# Patient Record
Sex: Female | Born: 1995 | Race: Black or African American | Hispanic: No | Marital: Single
Health system: Midwestern US, Community
[De-identification: ages and names within clinical notes are randomized; demographics above are authoritative.]

## PROBLEM LIST (undated history)

## (undated) DIAGNOSIS — R87629 Unspecified abnormal cytological findings in specimens from vagina: Secondary | ICD-10-CM

## (undated) DIAGNOSIS — D649 Anemia, unspecified: Secondary | ICD-10-CM

## (undated) DIAGNOSIS — R519 Headache, unspecified: Secondary | ICD-10-CM

## (undated) DIAGNOSIS — Z141 Cystic fibrosis carrier: Secondary | ICD-10-CM

## (undated) HISTORY — DX: Headache, unspecified: R51.9

## (undated) HISTORY — DX: Unspecified abnormal cytological findings in specimens from vagina: R87.629

---
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
## 2015-05-11 DIAGNOSIS — O4590 Premature separation of placenta, unspecified, unspecified trimester: Secondary | ICD-10-CM

## 2017-04-23 ENCOUNTER — Ambulatory Visit (INDEPENDENT_AMBULATORY_CARE_PROVIDER_SITE_OTHER): Payer: Medicaid Other | Admitting: Certified Nurse Midwife

## 2017-04-23 ENCOUNTER — Encounter: Payer: Self-pay | Admitting: Certified Nurse Midwife

## 2017-04-23 ENCOUNTER — Other Ambulatory Visit (HOSPITAL_COMMUNITY)
Admission: RE | Admit: 2017-04-23 | Discharge: 2017-04-23 | Disposition: A | Discharge status: Home / Self Care | Payer: Medicaid Other | Source: Ambulatory Visit | Attending: Certified Nurse Midwife | Admitting: Certified Nurse Midwife

## 2017-04-23 VITALS — BP 109/66 | HR 96 | Ht 62.0 in | Wt 110.0 lb

## 2017-04-23 DIAGNOSIS — O99613 Diseases of the digestive system complicating pregnancy, third trimester: Secondary | ICD-10-CM | POA: Diagnosis not present

## 2017-04-23 DIAGNOSIS — K219 Gastro-esophageal reflux disease without esophagitis: Secondary | ICD-10-CM | POA: Diagnosis not present

## 2017-04-23 DIAGNOSIS — O0933 Supervision of pregnancy with insufficient antenatal care, third trimester: Secondary | ICD-10-CM | POA: Diagnosis not present

## 2017-04-23 DIAGNOSIS — O0990 Supervision of high risk pregnancy, unspecified, unspecified trimester: Secondary | ICD-10-CM | POA: Insufficient documentation

## 2017-04-23 DIAGNOSIS — Z3A31 31 weeks gestation of pregnancy: Secondary | ICD-10-CM | POA: Insufficient documentation

## 2017-04-23 DIAGNOSIS — Z23 Encounter for immunization: Secondary | ICD-10-CM | POA: Diagnosis not present

## 2017-04-23 DIAGNOSIS — O0930 Supervision of pregnancy with insufficient antenatal care, unspecified trimester: Secondary | ICD-10-CM

## 2017-04-23 DIAGNOSIS — Z8759 Personal history of other complications of pregnancy, childbirth and the puerperium: Secondary | ICD-10-CM | POA: Insufficient documentation

## 2017-04-23 DIAGNOSIS — O0993 Supervision of high risk pregnancy, unspecified, third trimester: Secondary | ICD-10-CM

## 2017-04-23 DIAGNOSIS — O219 Vomiting of pregnancy, unspecified: Secondary | ICD-10-CM | POA: Diagnosis not present

## 2017-04-23 DIAGNOSIS — Z98891 History of uterine scar from previous surgery: Secondary | ICD-10-CM | POA: Diagnosis not present

## 2017-04-23 HISTORY — DX: Supervision of high risk pregnancy, unspecified, unspecified trimester: O09.90

## 2017-04-23 HISTORY — DX: Supervision of pregnancy with insufficient antenatal care, unspecified trimester: O09.30

## 2017-04-23 MED ORDER — DOXYLAMINE-PYRIDOXINE 10-10 MG PO TBEC: DELAYED_RELEASE_TABLET | ORAL | 4 refills | Status: DC

## 2017-04-23 MED ORDER — OMEPRAZOLE 20 MG PO CPDR: 20.0000 mg | DELAYED_RELEASE_CAPSULE | Freq: Two times a day (BID) | ORAL | 5 refills | Status: DC

## 2017-04-23 MED ORDER — VITAFOL-NANO 18-0.6-0.4 MG PO TABS: 1.0000 | ORAL_TABLET | Freq: Every day | ORAL | 12 refills | Status: DC

## 2017-04-23 MED ORDER — ONDANSETRON HCL 8 MG PO TABS: 8.0000 mg | ORAL_TABLET | Freq: Three times a day (TID) | ORAL | 2 refills | Status: DC | PRN

## 2017-04-23 NOTE — Progress Notes (Signed)
Pt presents for initial NOB visit. [place] Lives with FOB Annette Rodriguez. Pt c/o N&V. Pt opts for repeat c/s.

## 2017-04-23 NOTE — Progress Notes (Signed)
Subjective:   Annette Rodriguez is a 22 y.o. G2P0101 at [redacted]w[redacted]d by LMP being seen today for her first obstetrical visit.  Her obstetrical history is significant for previous 32 week delivery via emergency C-section for abruption in [place], medical records requested.  Does want repeat C-section. Patient does intend to breast feed. Pregnancy history fully reviewed.   Had prenatal care in [place], does not have all the records.   Patient reports heartburn, nausea, no bleeding, no leaking, occasional contractions and vomiting.  HISTORY: Obstetric History   G2   P1   T0   P1   A0   L1    SAB0   TAB0   Ectopic0   Multiple0   Live Births1     # Outcome Date GA Lbr Len/2nd Weight Sex Delivery Anes PTL Lv  2 Current           1 Preterm 05/11/15 [redacted]w[redacted]d  3 lb 15 oz (1.786 kg) F CS-LTranv EPI Y LIV     Complications: Placental abruption      Last pap smear was done unknown  History reviewed. No pertinent past medical history. Past Surgical History:  Procedure Laterality Date  . CESAREAN SECTION  05/11/2015   History reviewed. No pertinent family history. Social History   Tobacco Use  . Smoking status: Never Smoker  . Smokeless tobacco: Never Used  Substance Use Topics  . Alcohol use: No    Frequency: Never  . Drug use: No    Comment: Stopped +UPT    No Known Allergies Current Outpatient Medications on File Prior to Visit  Medication Sig Dispense Refill  . Prenatal Vit w/Fe-Methylfol-FA (PNV PO) Take by mouth.     No current facility-administered medications on file prior to visit.     Review of Systems Pertinent items noted in HPI and remainder of comprehensive ROS otherwise negative.  Exam   Vitals:   04/23/17 1136 04/23/17 1142  BP: 109/66   Pulse: 96   Weight: 110 lb (49.9 kg)   Height:  5\' 2"  (1.575 m)   Fetal Heart Rate (bpm): 152; doppler  Uterus:  Fundal Height: 26 cm  Pelvic Exam: Perineum: no hemorrhoids, normal perineum   Vulva: normal external genitalia, no lesions     Vagina:  normal mucosa, normal discharge   Cervix: no lesions and normal, pap smear done.    Adnexa: normal adnexa and no mass, fullness, tenderness   Bony Pelvis: average  System: General: well-developed, well-nourished female in no acute distress   Breast:  normal appearance, no masses or tenderness   Skin: normal coloration and turgor, no rashes   Neurologic: oriented, normal, negative, normal mood   Extremities: normal strength, tone, and muscle mass, ROM of all joints is normal   HEENT PERRLA, extraocular movement intact and sclera clear, anicteric   Mouth/Teeth mucous membranes moist, pharynx normal without lesions and dental hygiene good   Neck supple and no masses   Cardiovascular: regular rate and rhythm   Respiratory:  no respiratory distress, normal breath sounds   Abdomen: soft, non-tender; bowel sounds normal; no masses,  no organomegaly     Assessment:   Pregnancy: [id number] Patient Active Problem List   Diagnosis Date Noted  . Supervision of high risk pregnancy, antepartum 04/23/2017  . History of placenta abruption 04/23/2017  . Insufficient prenatal care 04/23/2017  . History of C-section 04/23/2017     Plan:  1. Supervision of high risk pregnancy, antepartum  Records requested.  S<D.  Repeat C-section - Obstetric Panel, Including HIV - Culture, OB Urine - Hemoglobinopathy evaluation - Hemoglobin A1c - VITAMIN D 25 Hydroxy (Vit-D Deficiency, Fractures) - Genetic Screening - Inheritest Core(CF97,SMA,FraX) - Cytology - PAP - Cervicovaginal ancillary only - US MFM OB COMP + 14 WK; Future - Tdap vaccine greater than or equal to 7yo IM - Prenatal-Fe Fum-Methf-FA w/o A (VITAFOL-NANO) 18-0.6-0.4 MG TABS; Take 1 tablet by mouth daily.  Dispense: 30 tablet; Refill: 12  2. History of placenta abruption     Precautions reviewed.   3. Insufficient prenatal care in third trimester     @31  weeks, limited records recieved  4. Nausea/vomiting in pregnancy       - Doxylamine-Pyridoxine (DICLEGIS) 10-10 MG TBEC; Take 1 tablet with breakfast and lunch.  Take 2 tablets at bedtime.  Dispense: 100 tablet; Refill: 4 - ondansetron (ZOFRAN) 8 MG tablet; Take 1 tablet (8 mg total) by mouth every 8 (eight) hours as needed for nausea or vomiting.  Dispense: 40 tablet; Refill: 2  5. Gastroesophageal reflux during pregnancy, antepartum, third trimester     OTC TUMS - omeprazole (PRILOSEC) 20 MG capsule; Take 1 capsule (20 mg total) by mouth 2 (two) times daily before a meal.  Dispense: 60 capsule; Refill: 5   Initial labs drawn. Continue prenatal vitamins. Genetic Screening discussed, NIPS: ordered. Ultrasound discussed; fetal anatomic survey: ordered. Problem list reviewed and updated. The nature of [facility] with multiple MDs and other Advanced Practice Providers was explained to patient; also emphasized that residents, students are part of our team. Routine obstetric precautions reviewed. Return in about 2 weeks (around 05/07/2017) for ROB, needs 2 hour OGTT ASAP, Needs to see FP MD here, HOB.     Orvilla Cornwallachelle Elizah Mierzwa, CNM [facility]

## 2017-04-24 LAB — CERVICOVAGINAL ANCILLARY ONLY
Bacterial vaginitis: NEGATIVE
CANDIDA VAGINITIS: NEGATIVE
Chlamydia: NEGATIVE
Neisseria Gonorrhea: NEGATIVE
Trichomonas: NEGATIVE

## 2017-04-24 LAB — VITAMIN D 25 HYDROXY (VIT D DEFICIENCY, FRACTURES): VIT D 25 HYDROXY: 12.7 ng/mL — AB (ref 30.0–100.0)

## 2017-04-25 ENCOUNTER — Other Ambulatory Visit: Payer: Self-pay | Admitting: Certified Nurse Midwife

## 2017-04-25 ENCOUNTER — Other Ambulatory Visit: Payer: Medicaid Other

## 2017-04-25 DIAGNOSIS — E559 Vitamin D deficiency, unspecified: Secondary | ICD-10-CM

## 2017-04-25 HISTORY — DX: Vitamin D deficiency, unspecified: E55.9

## 2017-04-25 LAB — OBSTETRIC PANEL, INCLUDING HIV
ANTIBODY SCREEN: NEGATIVE
Basophils Absolute: 0 10*3/uL (ref 0.0–0.2)
Basos: 0 %
EOS (ABSOLUTE): 0 10*3/uL (ref 0.0–0.4)
Eos: 0 %
HEMOGLOBIN: 9.3 g/dL — AB (ref 11.1–15.9)
HEP B S AG: NEGATIVE
HIV Screen 4th Generation wRfx: NONREACTIVE
Hematocrit: 29.2 % — ABNORMAL LOW (ref 34.0–46.6)
IMMATURE GRANS (ABS): 0.1 10*3/uL (ref 0.0–0.1)
Immature Granulocytes: 1 %
LYMPHS: 17 %
Lymphocytes Absolute: 2.1 10*3/uL (ref 0.7–3.1)
MCH: 28.1 pg (ref 26.6–33.0)
MCHC: 31.8 g/dL (ref 31.5–35.7)
MCV: 88 fL (ref 79–97)
MONOCYTES: 6 %
Monocytes Absolute: 0.7 10*3/uL (ref 0.1–0.9)
Neutrophils Absolute: 9.3 10*3/uL — ABNORMAL HIGH (ref 1.4–7.0)
Neutrophils: 76 %
Platelets: 277 10*3/uL (ref 150–379)
RBC: 3.31 x10E6/uL — ABNORMAL LOW (ref 3.77–5.28)
RDW: 13.6 % (ref 12.3–15.4)
RPR: NONREACTIVE
RUBELLA: 3.02 {index} (ref >0.99)
Rh Factor: POSITIVE
WBC: 12.3 10*3/uL — ABNORMAL HIGH (ref 3.4–10.8)

## 2017-04-25 LAB — HEMOGLOBINOPATHY EVALUATION
HEMOGLOBIN A2 QUANTITATION: 1.9 % (ref 1.8–3.2)
HEMOGLOBIN F QUANTITATION: 0 % (ref 0.0–2.0)
HGB C: 0 %
HGB S: 0 %
HGB VARIANT: 0 %
Hgb A: 98.1 % (ref 96.4–98.8)

## 2017-04-25 LAB — CULTURE, OB URINE

## 2017-04-25 LAB — CYTOLOGY - PAP
Adequacy: ABSENT
Diagnosis: NEGATIVE

## 2017-04-25 LAB — URINE CULTURE, OB REFLEX

## 2017-04-25 LAB — HEMOGLOBIN A1C
Est. average glucose Bld gHb Est-mCnc: 100 mg/dL
Hgb A1c MFr Bld: 5.1 % (ref 4.8–5.6)

## 2017-04-25 MED ORDER — VITAMIN D (ERGOCALCIFEROL) 1.25 MG (50000 UNIT) PO CAPS: 50000.0000 [IU] | ORAL_CAPSULE | ORAL | 2 refills | Status: DC

## 2017-04-29 ENCOUNTER — Encounter (HOSPITAL_COMMUNITY): Payer: Self-pay

## 2017-04-29 ENCOUNTER — Telehealth: Payer: Self-pay

## 2017-04-29 ENCOUNTER — Ambulatory Visit (HOSPITAL_COMMUNITY)
Admission: RE | Admit: 2017-04-29 | Discharge: 2017-04-29 | Disposition: A | Discharge status: Home / Self Care | Payer: Medicaid Other | Source: Ambulatory Visit | Attending: Certified Nurse Midwife | Admitting: Certified Nurse Midwife

## 2017-04-29 ENCOUNTER — Other Ambulatory Visit: Payer: Self-pay | Admitting: Certified Nurse Midwife

## 2017-04-29 ENCOUNTER — Other Ambulatory Visit: Payer: Medicaid Other

## 2017-04-29 DIAGNOSIS — O0990 Supervision of high risk pregnancy, unspecified, unspecified trimester: Secondary | ICD-10-CM

## 2017-04-29 DIAGNOSIS — O0993 Supervision of high risk pregnancy, unspecified, third trimester: Secondary | ICD-10-CM | POA: Diagnosis present

## 2017-04-29 DIAGNOSIS — O99013 Anemia complicating pregnancy, third trimester: Secondary | ICD-10-CM

## 2017-04-29 DIAGNOSIS — O09293 Supervision of pregnancy with other poor reproductive or obstetric history, third trimester: Secondary | ICD-10-CM | POA: Diagnosis present

## 2017-04-29 DIAGNOSIS — Z98891 History of uterine scar from previous surgery: Secondary | ICD-10-CM | POA: Insufficient documentation

## 2017-04-29 DIAGNOSIS — O09213 Supervision of pregnancy with history of pre-term labor, third trimester: Secondary | ICD-10-CM

## 2017-04-29 DIAGNOSIS — Z3A32 32 weeks gestation of pregnancy: Secondary | ICD-10-CM | POA: Insufficient documentation

## 2017-04-29 DIAGNOSIS — O09299 Supervision of pregnancy with other poor reproductive or obstetric history, unspecified trimester: Secondary | ICD-10-CM

## 2017-04-29 DIAGNOSIS — O99019 Anemia complicating pregnancy, unspecified trimester: Secondary | ICD-10-CM | POA: Insufficient documentation

## 2017-04-29 HISTORY — DX: Anemia complicating pregnancy, third trimester: O99.013

## 2017-04-29 MED ORDER — CITRANATAL BLOOM 90-1 MG PO TABS: 1.0000 | ORAL_TABLET | Freq: Every day | ORAL | 12 refills | Status: DC

## 2017-04-29 NOTE — Telephone Encounter (Signed)
-----   Message from Roe Coombsachelle A Denney, CNM sent at 04/29/2017  9:01 AM EST ----- Please let her know that she is anemic.  I have added bloom to her other PNV.  Thank you. R.Denney CNM

## 2017-04-29 NOTE — Telephone Encounter (Signed)
Patient notified.  Verbalized understanding. 

## 2017-04-30 LAB — GLUCOSE TOLERANCE, 2 HOURS W/ 1HR
GLUCOSE, 2 HOUR: 89 mg/dL (ref 65–152)
Glucose, 1 hour: 96 mg/dL (ref 65–179)
Glucose, Fasting: 82 mg/dL (ref 65–91)

## 2017-04-30 LAB — CBC
HEMATOCRIT: 28.5 % — AB (ref 34.0–46.6)
HEMOGLOBIN: 9.6 g/dL — AB (ref 11.1–15.9)
MCH: 29.4 pg (ref 26.6–33.0)
MCHC: 33.7 g/dL (ref 31.5–35.7)
MCV: 87 fL (ref 79–97)
Platelets: 246 10*3/uL (ref 150–379)
RBC: 3.27 x10E6/uL — ABNORMAL LOW (ref 3.77–5.28)
RDW: 13.8 % (ref 12.3–15.4)
WBC: 11.6 10*3/uL — ABNORMAL HIGH (ref 3.4–10.8)

## 2017-04-30 LAB — HIV ANTIBODY (ROUTINE TESTING W REFLEX): HIV Screen 4th Generation wRfx: NONREACTIVE

## 2017-04-30 LAB — RPR: RPR: NONREACTIVE

## 2017-05-01 ENCOUNTER — Other Ambulatory Visit: Payer: Self-pay | Admitting: Certified Nurse Midwife

## 2017-05-01 DIAGNOSIS — O99013 Anemia complicating pregnancy, third trimester: Secondary | ICD-10-CM

## 2017-05-01 DIAGNOSIS — O0990 Supervision of high risk pregnancy, unspecified, unspecified trimester: Secondary | ICD-10-CM

## 2017-05-07 ENCOUNTER — Other Ambulatory Visit: Payer: Self-pay | Admitting: Certified Nurse Midwife

## 2017-05-07 ENCOUNTER — Ambulatory Visit (INDEPENDENT_AMBULATORY_CARE_PROVIDER_SITE_OTHER): Payer: Medicaid Other | Admitting: Obstetrics and Gynecology

## 2017-05-07 ENCOUNTER — Encounter: Payer: Self-pay | Admitting: Obstetrics and Gynecology

## 2017-05-07 VITALS — BP 116/70 | HR 102 | Wt 114.5 lb

## 2017-05-07 DIAGNOSIS — O0990 Supervision of high risk pregnancy, unspecified, unspecified trimester: Secondary | ICD-10-CM

## 2017-05-07 DIAGNOSIS — Z141 Cystic fibrosis carrier: Secondary | ICD-10-CM | POA: Diagnosis not present

## 2017-05-07 DIAGNOSIS — Z98891 History of uterine scar from previous surgery: Secondary | ICD-10-CM

## 2017-05-07 DIAGNOSIS — Z8759 Personal history of other complications of pregnancy, childbirth and the puerperium: Secondary | ICD-10-CM | POA: Diagnosis not present

## 2017-05-07 LAB — INHERITEST CORE(CF97,SMA,FRAX)

## 2017-05-07 NOTE — Progress Notes (Signed)
Pt complains of having bilateral leg pain

## 2017-05-07 NOTE — Progress Notes (Signed)
Subjective:  Annette Rodriguez is a 22 y.o. G2P0101 at 1258w1d being seen today for ongoing prenatal care.  She is currently monitored for the following issues for this high-risk pregnancy and has Supervision of high risk pregnancy, antepartum; History of placenta abruption; Insufficient prenatal care; History of C-section; Vitamin D deficiency; Anemia affecting pregnancy in third trimester; and Cystic fibrosis carrier on their problem list.  Patient reports no complaints.  Contractions: Irregular. Vag. Bleeding: None.  Movement: Present. Denies leaking of fluid.   The following portions of the patient's history were reviewed and updated as appropriate: allergies, current medications, past family history, past medical history, past social history, past surgical history and problem list. Problem list updated.  Objective:   Vitals:   05/07/17 0959  BP: 116/70  Pulse: (!) 102  Weight: 114 lb 8 oz (51.9 kg)    Fetal Status: Fetal Heart Rate (bpm): 155   Movement: Present     General:  Alert, oriented and cooperative. Patient is in no acute distress.  Skin: Skin is warm and dry. No rash noted.   Cardiovascular: Normal heart rate noted  Respiratory: Normal respiratory effort, no problems with respiration noted  Abdomen: Soft, gravid, appropriate for gestational age. Pain/Pressure: Absent     Pelvic:  Cervical exam deferred        Extremities: Normal range of motion.  Edema: None  Mental Status: Normal mood and affect. Normal behavior. Normal judgment and thought content.   Urinalysis:      Assessment and Plan:  Pregnancy: G2P0101 at 7158w1d  1. Supervision of high risk pregnancy, antepartum Stable  2. History of placenta abruption   3. History of C-section Desires repeat  4. Cystic fibrosis carrier   Preterm labor symptoms and general obstetric precautions including but not limited to vaginal bleeding, contractions, leaking of fluid and fetal movement were reviewed in detail with the  patient. Please refer to After Visit Summary for other counseling recommendations.  Return in about 2 weeks (around 05/21/2017) for OB visit.   Hermina StaggersErvin, Romone Shaff L, MD

## 2017-05-07 NOTE — Patient Instructions (Signed)
Third Trimester of Pregnancy The third trimester is from week 28 through week 40 (months 7 through 9). The third trimester is a time when the unborn baby (fetus) is growing rapidly. At the end of the ninth month, the fetus is about 20 inches in length and weighs 6-10 pounds. Body changes during your third trimester Your body will continue to go through many changes during pregnancy. The changes vary from woman to woman. During the third trimester:  Your weight will continue to increase. You can expect to gain 25-35 pounds (11-16 kg) by the end of the pregnancy.  You may begin to get stretch marks on your hips, abdomen, and breasts.  You may urinate more often because the fetus is moving lower into your pelvis and pressing on your bladder.  You may develop or continue to have heartburn. This is caused by increased hormones that slow down muscles in the digestive tract.  You may develop or continue to have constipation because increased hormones slow digestion and cause the muscles that push waste through your intestines to relax.  You may develop hemorrhoids. These are swollen veins (varicose veins) in the rectum that can itch or be painful.  You may develop swollen, bulging veins (varicose veins) in your legs.  You may have increased body aches in the pelvis, back, or thighs. This is due to weight gain and increased hormones that are relaxing your joints.  You may have changes in your hair. These can include thickening of your hair, rapid growth, and changes in texture. Some women also have hair loss during or after pregnancy, or hair that feels dry or thin. Your hair will most likely return to normal after your baby is born.  Your breasts will continue to grow and they will continue to become tender. A yellow fluid (colostrum) may leak from your breasts. This is the first milk you are producing for your baby.  Your belly button may stick out.  You may notice more swelling in your hands,  face, or ankles.  You may have increased tingling or numbness in your hands, arms, and legs. The skin on your belly may also feel numb.  You may feel short of breath because of your expanding uterus.  You may have more problems sleeping. This can be caused by the size of your belly, increased need to urinate, and an increase in your body's metabolism.  You may notice the fetus "dropping," or moving lower in your abdomen (lightening).  You may have increased vaginal discharge.  You may notice your joints feel loose and you may have pain around your pelvic bone.  What to expect at prenatal visits You will have prenatal exams every 2 weeks until week 36. Then you will have weekly prenatal exams. During a routine prenatal visit:  You will be weighed to make sure you and the baby are growing normally.  Your blood pressure will be taken.  Your abdomen will be measured to track your baby's growth.  The fetal heartbeat will be listened to.  Any test results from the previous visit will be discussed.  You may have a cervical check near your due date to see if your cervix has softened or thinned (effaced).  You will be tested for Group B streptococcus. This happens between 35 and 37 weeks.  Your health care provider may ask you:  What your birth plan is.  How you are feeling.  If you are feeling the baby move.  If you have had   any abnormal symptoms, such as leaking fluid, bleeding, severe headaches, or abdominal cramping.  If you are using any tobacco products, including cigarettes, chewing tobacco, and electronic cigarettes.  If you have any questions.  Other tests or screenings that may be performed during your third trimester include:  Blood tests that check for low iron levels (anemia).  Fetal testing to check the health, activity level, and growth of the fetus. Testing is done if you have certain medical conditions or if there are problems during the  pregnancy.  Nonstress test (NST). This test checks the health of your baby to make sure there are no signs of problems, such as the baby not getting enough oxygen. During this test, a belt is placed around your belly. The baby is made to move, and its heart rate is monitored during movement.  What is false labor? False labor is a condition in which you feel small, irregular tightenings of the muscles in the womb (contractions) that usually go away with rest, changing position, or drinking water. These are called Braxton Hicks contractions. Contractions may last for hours, days, or even weeks before true labor sets in. If contractions come at regular intervals, become more frequent, increase in intensity, or become painful, you should see your health care provider. What are the signs of labor?  Abdominal cramps.  Regular contractions that start at 10 minutes apart and become stronger and more frequent with time.  Contractions that start on the top of the uterus and spread down to the lower abdomen and back.  Increased pelvic pressure and dull back pain.  A watery or bloody mucus discharge that comes from the vagina.  Leaking of amniotic fluid. This is also known as your "water breaking." It could be a slow trickle or a gush. Let your health care provider know if it has a color or strange odor. If you have any of these signs, call your health care provider right away, even if it is before your due date. Follow these instructions at home: Medicines  Follow your health care provider's instructions regarding medicine use. Specific medicines may be either safe or unsafe to take during pregnancy.  Take a prenatal vitamin that contains at least 600 micrograms (mcg) of folic acid.  If you develop constipation, try taking a stool softener if your health care provider approves. Eating and drinking  Eat a balanced diet that includes fresh fruits and vegetables, whole grains, good sources of protein  such as meat, eggs, or tofu, and low-fat dairy. Your health care provider will help you determine the amount of weight gain that is right for you.  Avoid raw meat and uncooked cheese. These carry germs that can cause birth defects in the baby.  If you have low calcium intake from food, talk to your health care provider about whether you should take a daily calcium supplement.  Eat four or five small meals rather than three large meals a day.  Limit foods that are high in fat and processed sugars, such as fried and sweet foods.  To prevent constipation: ? Drink enough fluid to keep your urine clear or pale yellow. ? Eat foods that are high in fiber, such as fresh fruits and vegetables, whole grains, and beans. Activity  Exercise only as directed by your health care provider. Most women can continue their usual exercise routine during pregnancy. Try to exercise for 30 minutes at least 5 days a week. Stop exercising if you experience uterine contractions.  Avoid heavy   lifting.  Do not exercise in extreme heat or humidity, or at high altitudes.  Wear low-heel, comfortable shoes.  Practice good posture.  You may continue to have sex unless your health care provider tells you otherwise. Relieving pain and discomfort  Take frequent breaks and rest with your legs elevated if you have leg cramps or low back pain.  Take warm sitz baths to soothe any pain or discomfort caused by hemorrhoids. Use hemorrhoid cream if your health care provider approves.  Wear a good support bra to prevent discomfort from breast tenderness.  If you develop varicose veins: ? Wear support pantyhose or compression stockings as told by your healthcare provider. ? Elevate your feet for 15 minutes, 3-4 times a day. Prenatal care  Write down your questions. Take them to your prenatal visits.  Keep all your prenatal visits as told by your health care provider. This is important. Safety  Wear your seat belt at  all times when driving.  Make a list of emergency phone numbers, including numbers for family, friends, the hospital, and police and fire departments. General instructions  Avoid cat litter boxes and soil used by cats. These carry germs that can cause birth defects in the baby. If you have a cat, ask someone to clean the litter box for you.  Do not travel far distances unless it is absolutely necessary and only with the approval of your health care provider.  Do not use hot tubs, steam rooms, or saunas.  Do not drink alcohol.  Do not use any products that contain nicotine or tobacco, such as cigarettes and e-cigarettes. If you need help quitting, ask your health care provider.  Do not use any medicinal herbs or unprescribed drugs. These chemicals affect the formation and growth of the baby.  Do not douche or use tampons or scented sanitary pads.  Do not cross your legs for long periods of time.  To prepare for the arrival of your baby: ? Take prenatal classes to understand, practice, and ask questions about labor and delivery. ? Make a trial run to the hospital. ? Visit the hospital and tour the maternity area. ? Arrange for maternity or paternity leave through employers. ? Arrange for family and friends to take care of pets while you are in the hospital. ? Purchase a rear-facing car seat and make sure you know how to install it in your car. ? Pack your hospital bag. ? Prepare the baby's nursery. Make sure to remove all pillows and stuffed animals from the baby's crib to prevent suffocation.  Visit your dentist if you have not gone during your pregnancy. Use a soft toothbrush to brush your teeth and be gentle when you floss. Contact a health care provider if:  You are unsure if you are in labor or if your water has broken.  You become dizzy.  You have mild pelvic cramps, pelvic pressure, or nagging pain in your abdominal area.  You have lower back pain.  You have persistent  nausea, vomiting, or diarrhea.  You have an unusual or bad smelling vaginal discharge.  You have pain when you urinate. Get help right away if:  Your water breaks before 37 weeks.  You have regular contractions less than 5 minutes apart before 37 weeks.  You have a fever.  You are leaking fluid from your vagina.  You have spotting or bleeding from your vagina.  You have severe abdominal pain or cramping.  You have rapid weight loss or weight gain.    You have shortness of breath with chest pain.  You notice sudden or extreme swelling of your face, hands, ankles, feet, or legs.  Your baby makes fewer than 10 movements in 2 hours.  You have severe headaches that do not go away when you take medicine.  You have vision changes. Summary  The third trimester is from week 28 through week 40, months 7 through 9. The third trimester is a time when the unborn baby (fetus) is growing rapidly.  During the third trimester, your discomfort may increase as you and your baby continue to gain weight. You may have abdominal, leg, and back pain, sleeping problems, and an increased need to urinate.  During the third trimester your breasts will keep growing and they will continue to become tender. A yellow fluid (colostrum) may leak from your breasts. This is the first milk you are producing for your baby.  False labor is a condition in which you feel small, irregular tightenings of the muscles in the womb (contractions) that eventually go away. These are called Braxton Hicks contractions. Contractions may last for hours, days, or even weeks before true labor sets in.  Signs of labor can include: abdominal cramps; regular contractions that start at 10 minutes apart and become stronger and more frequent with time; watery or bloody mucus discharge that comes from the vagina; increased pelvic pressure and dull back pain; and leaking of amniotic fluid. This information is not intended to replace advice  given to you by your health care provider. Make sure you discuss any questions you have with your health care provider. Document Released: 02/12/2001 Document Revised: 07/27/2015 Document Reviewed: 04/21/2012 Elsevier Interactive Patient Education  2017 [employer]  

## 2017-05-13 ENCOUNTER — Other Ambulatory Visit: Payer: Self-pay | Admitting: Certified Nurse Midwife

## 2017-05-13 DIAGNOSIS — O0990 Supervision of high risk pregnancy, unspecified, unspecified trimester: Secondary | ICD-10-CM

## 2017-05-21 ENCOUNTER — Encounter: Payer: Medicaid Other | Admitting: Obstetrics & Gynecology

## 2017-05-23 ENCOUNTER — Encounter (HOSPITAL_COMMUNITY): Payer: Self-pay

## 2017-05-23 ENCOUNTER — Other Ambulatory Visit: Payer: Self-pay

## 2017-05-23 ENCOUNTER — Inpatient Hospital Stay (HOSPITAL_COMMUNITY)
Admission: AD | Admit: 2017-05-23 | Discharge: 2017-05-23 | Disposition: A | Discharge status: Home / Self Care | Payer: Medicaid Other | Source: Ambulatory Visit | Attending: Obstetrics and Gynecology | Admitting: Obstetrics and Gynecology

## 2017-05-23 DIAGNOSIS — O26893 Other specified pregnancy related conditions, third trimester: Secondary | ICD-10-CM | POA: Diagnosis present

## 2017-05-23 DIAGNOSIS — R109 Unspecified abdominal pain: Secondary | ICD-10-CM | POA: Insufficient documentation

## 2017-05-23 DIAGNOSIS — O479 False labor, unspecified: Secondary | ICD-10-CM

## 2017-05-23 DIAGNOSIS — Z3A35 35 weeks gestation of pregnancy: Secondary | ICD-10-CM | POA: Insufficient documentation

## 2017-05-23 DIAGNOSIS — O4700 False labor before 37 completed weeks of gestation, unspecified trimester: Secondary | ICD-10-CM

## 2017-05-23 DIAGNOSIS — Z79899 Other long term (current) drug therapy: Secondary | ICD-10-CM | POA: Diagnosis not present

## 2017-05-23 DIAGNOSIS — N858 Other specified noninflammatory disorders of uterus: Secondary | ICD-10-CM

## 2017-05-23 DIAGNOSIS — N859 Noninflammatory disorder of uterus, unspecified: Secondary | ICD-10-CM

## 2017-05-23 HISTORY — DX: Anemia, unspecified: D64.9

## 2017-05-23 LAB — URINALYSIS, MICROSCOPIC (REFLEX)

## 2017-05-23 LAB — URINALYSIS, ROUTINE W REFLEX MICROSCOPIC
GLUCOSE, UA: NEGATIVE mg/dL
Ketones, ur: >80 mg/dL — AB
Nitrite: NEGATIVE
PH: 7 (ref 5.0–8.0)
Protein, ur: NEGATIVE mg/dL
Specific Gravity, Urine: 1.01 (ref 1.005–1.030)

## 2017-05-23 MED ORDER — DEXTROSE IN LACTATED RINGERS 5 % IV SOLN
INTRAVENOUS | Status: DC
  Administered 2017-05-23: 02:00:00 via INTRAVENOUS

## 2017-05-23 MED ORDER — NIFEDIPINE 10 MG PO CAPS
10.0000 mg | ORAL_CAPSULE | ORAL | Status: DC | PRN
  Administered 2017-05-23 (×2): 10 mg via ORAL
  Filled 2017-05-23 (×3): qty 1

## 2017-05-23 MED ORDER — LACTATED RINGERS IV SOLN
INTRAVENOUS | Status: DC
  Administered 2017-05-23: 01:00:00 via INTRAVENOUS

## 2017-05-23 MED ORDER — CALCIUM CARBONATE ANTACID 500 MG PO CHEW
1.0000 | CHEWABLE_TABLET | Freq: Two times a day (BID) | ORAL | Status: DC
  Administered 2017-05-23: 200 mg via ORAL
  Filled 2017-05-23: qty 1

## 2017-05-23 NOTE — [facility] Note (Signed)
Pt presents to [facility] c/o ctxs that started around 1930 pt states they were irregular and have now have gotten closer together. Pt states they are every 3-394min. Pt denies vaginal bleeding or LOF. +FM.

## 2017-05-23 NOTE — Discharge Instructions (Signed)
Braxton Hicks Contractions °Contractions of the uterus can occur throughout pregnancy, but they are not always a sign that you are in labor. You may have practice contractions called Braxton Hicks contractions. These false labor contractions are sometimes confused with true labor. °What are Braxton Hicks contractions? °Braxton Hicks contractions are tightening movements that occur in the muscles of the uterus before labor. Unlike true labor contractions, these contractions do not result in opening (dilation) and thinning of the cervix. Toward the end of pregnancy (32-34 weeks), Braxton Hicks contractions can happen more often and may become stronger. These contractions are sometimes difficult to tell apart from true labor because they can be very uncomfortable. You should not feel embarrassed if you go to the hospital with false labor. °Sometimes, the only way to tell if you are in true labor is for your health care provider to look for changes in the cervix. The health care provider will do a physical exam and may monitor your contractions. If you are not in true labor, the exam should show that your cervix is not dilating and your water has not broken. °If there are other health problems associated with your pregnancy, it is completely safe for you to be sent home with false labor. You may continue to have Braxton Hicks contractions until you go into true labor. °How to tell the difference between true labor and false labor °True labor °· Contractions last 30-70 seconds. °· Contractions become very regular. °· Discomfort is usually felt in the top of the uterus, and it spreads to the lower abdomen and low back. °· Contractions do not go away with walking. °· Contractions usually become more intense and increase in frequency. °· The cervix dilates and gets thinner. °False labor °· Contractions are usually shorter and not as strong as true labor contractions. °· Contractions are usually irregular. °· Contractions  are often felt in the front of the lower abdomen and in the groin. °· Contractions may go away when you walk around or change positions while lying down. °· Contractions get weaker and are shorter-lasting as time goes on. °· The cervix usually does not dilate or become thin. °Follow these instructions at home: °· Take over-the-counter and prescription medicines only as told by your health care provider. °· Keep up with your usual exercises and follow other instructions from your health care provider. °· Eat and drink lightly if you think you are going into labor. °· If Braxton Hicks contractions are making you uncomfortable: °? Change your position from lying down or resting to walking, or change from walking to resting. °? Sit and rest in a tub of warm water. °? Drink enough fluid to keep your urine pale yellow. Dehydration may cause these contractions. °? Do slow and deep breathing several times an hour. °· Keep all follow-up prenatal visits as told by your health care provider. This is important. °Contact a health care provider if: °· You have a fever. °· You have continuous pain in your abdomen. °Get help right away if: °· Your contractions become stronger, more regular, and closer together. °· You have fluid leaking or gushing from your vagina. °· You pass blood-tinged mucus (bloody show). °· You have bleeding from your vagina. °· You have low back pain that you never had before. °· You feel your baby’s head pushing down and causing pelvic pressure. °· Your baby is not moving inside you as much as it used to. °Summary °· Contractions that occur before labor are called Braxton   Hicks contractions, false labor, or practice contractions. °· Braxton Hicks contractions are usually shorter, weaker, farther apart, and less regular than true labor contractions. True labor contractions usually become progressively stronger and regular and they become more frequent. °· Manage discomfort from Braxton Hicks contractions by  changing position, resting in a warm bath, drinking plenty of water, or practicing deep breathing. °This information is not intended to replace advice given to you by your health care provider. Make sure you discuss any questions you have with your health care provider. °Document Released: 07/04/2016 Document Revised: 07/04/2016 Document Reviewed: 07/04/2016 °[employer] Interactive Patient Education © 2018 [employer] °Preterm Labor and Birth Information °Pregnancy normally lasts 39-41 weeks. Preterm labor is when labor starts early. It starts before you have been pregnant for 37 whole weeks. °What are the risk factors for preterm labor? °Preterm labor is more likely to occur in women who: °· Have an infection while pregnant. °· Have a cervix that is short. °· Have gone into preterm labor before. °· Have had surgery on their cervix. °· Are younger than age 17. °· Are older than age 35. °· Are African American. °· Are pregnant with two or more babies. °· Take street drugs while pregnant. °· Smoke while pregnant. °· Do not gain enough weight while pregnant. °· Got pregnant right after another pregnancy. ° °What are the symptoms of preterm labor? °Symptoms of preterm labor include: °· Cramps. The cramps may feel like the cramps some women get during their period. The cramps may happen with watery poop (diarrhea). °· Pain in the belly (abdomen). °· Pain in the lower back. °· Regular contractions or tightening. It may feel like your belly is getting tighter. °· Pressure in the lower belly that seems to get stronger. °· More fluid (discharge) leaking from the vagina. The fluid may be watery or bloody. °· Water breaking. ° °Why is it important to notice signs of preterm labor? °Babies who are born early may not be fully developed. They have a higher chance for: °· Long-term heart problems. °· Long-term lung problems. °· Trouble controlling body systems, like breathing. °· Bleeding in the brain. °· A condition called  cerebral palsy. °· Learning difficulties. °· Death. ° °These risks are highest for babies who are born before 34 weeks of pregnancy. °How is preterm labor treated? °Treatment depends on: °· How long you were pregnant. °· Your condition. °· The health of your baby. ° °Treatment may involve: °· Having a stitch (suture) placed in your cervix. When you give birth, your cervix opens so the baby can come out. The stitch keeps the cervix from opening too soon. °· Staying at the hospital. °· Taking or getting medicines, such as: °? Hormone medicines. °? Medicines to stop contractions. °? Medicines to help the baby’s lungs develop. °? Medicines to prevent your baby from having cerebral palsy. ° °What should I do if I am in preterm labor? °If you think you are going into labor too soon, call your doctor right away. °How can I prevent preterm labor? °· Do not use any tobacco products. °? Examples of these are cigarettes, chewing tobacco, and e-cigarettes. °? If you need help quitting, ask your doctor. °· Do not use street drugs. °· Do not use any medicines unless you ask your doctor if they are safe for you. °· Talk with your doctor before taking any herbal supplements. °· Make sure you gain enough weight. °· Watch for infection. If you think you might have an   infection, get it checked right away. °· If you have gone into preterm labor before, tell your doctor. °This information is not intended to replace advice given to you by your health care provider. Make sure you discuss any questions you have with your health care provider. °Document Released: 05/17/2008 Document Revised: 08/01/2015 Document Reviewed: 07/12/2015 °[employer] Interactive Patient Education © 2018 [employer] ° °

## 2017-05-23 NOTE — MAU Provider Note (Signed)
Chief Complaint:  Contractions   First Provider Initiated Contact with Patient 05/23/17 [phone]     HPI: Annette Rodriguez is a 22 y.o. G2P0101 at 2w3dwho presents to maternity admissions reporting uterine contractions since 1930.  They have gotten more frequent. Does have a history of PTL with first baby. . She reports good fetal movement, denies LOF, vaginal bleeding, vaginal itching/burning, urinary symptoms, h/a, dizziness, n/v, diarrhea, constipation or fever/chills.  She denies headache, visual changes or RUQ abdominal pain.  Abdominal Pain  This is a new problem. The current episode started today. The onset quality is gradual. The problem has been unchanged. The pain is mild. The quality of the pain is cramping. The abdominal pain does not radiate. Pertinent negatives include no constipation, diarrhea, dysuria, fever, headaches or myalgias. Nothing aggravates the pain. The pain is relieved by nothing. She has tried nothing for the symptoms.   RN Note: Pt presents to MAU c/o ctxs that started around 1930 pt states they were irregular and have now have gotten closer together. Pt states they are every 3-2min. Pt denies vaginal bleeding or LOF. +FM    Past Medical History: History reviewed. No pertinent past medical history.  Past obstetric history: OB History  Gravida Para Term Preterm AB Living  2 1   1   1   SAB TAB Ectopic Multiple Live Births          1    # Outcome Date GA Lbr Len/2nd Weight Sex Delivery Anes PTL Lv  2 Current           1 Preterm 05/11/15 [redacted]w[redacted]d  3 lb 15 oz (1.786 kg) F CS-LTranv EPI Y LIV     Complications: Placental abruption    Past Surgical History: Past Surgical History:  Procedure Laterality Date  . CESAREAN SECTION  05/11/2015    Family History: No family history on file.  Social History: Social History   Tobacco Use  . Smoking status: Never Smoker  . Smokeless tobacco: Never Used  Substance Use Topics  . Alcohol use: No    Frequency: Never   . Drug use: No    Types: Marijuana    Comment: Stopped +UPT     Allergies: No Known Allergies  Meds:  Medications Prior to Admission  Medication Sig Dispense Refill Last Dose  . ondansetron (ZOFRAN) 8 MG tablet Take 1 tablet (8 mg total) by mouth every 8 (eight) hours as needed for nausea or vomiting. 40 tablet 2 05/22/2017 at Unknown time  . Prenatal-Fe Fum-Methf-FA w/o A (VITAFOL-NANO) 18-0.6-0.4 MG TABS Take 1 tablet by mouth daily. 30 tablet 12 05/22/2017 at Unknown time  . Doxylamine-Pyridoxine (DICLEGIS) 10-10 MG TBEC Take 1 tablet with breakfast and lunch.  Take 2 tablets at bedtime. 100 tablet 4 Taking  . omeprazole (PRILOSEC) 20 MG capsule Take 1 capsule (20 mg total) by mouth 2 (two) times daily before a meal. 60 capsule 5 Taking  . Prenatal Vit w/Fe-Methylfol-FA (PNV PO) Take by mouth.   Taking  . Prenatal-DSS-FeCb-FeGl-FA (CITRANATAL BLOOM) 90-1 MG TABS Take 1 tablet by mouth daily. 30 tablet 12 Taking  . Vitamin D, Ergocalciferol, (DRISDOL) 50000 units CAPS capsule Take 1 capsule (50,000 Units total) by mouth every 7 (seven) days. 30 capsule 2 Taking    I have reviewed patient's Past Medical Hx, Surgical Hx, Family Hx, Social Hx, medications and allergies.   ROS:  Review of Systems  Constitutional: Negative for fever.  Gastrointestinal: Positive for abdominal pain. Negative for constipation and  diarrhea.  Genitourinary: Negative for dysuria.  Musculoskeletal: Negative for myalgias.  Neurological: Negative for headaches.   Other systems negative  Physical Exam   Patient Vitals for the past 24 hrs:  BP Temp Temp src Pulse Resp Height Weight  05/23/17 0030 (!) 113/57 98 F (36.7 C) Oral 91 19 4\' 11"  (1.499 m) 108 lb 1.9 oz (49 kg)   Constitutional: Well-developed, well-nourished female in no acute distress.  Cardiovascular: normal rate and rhythm Respiratory: normal effort, clear to auscultation bilaterally GI: Abd soft, non-tender, gravid appropriate for  gestational age.   No rebound or guarding. MS: Extremities nontender, no edema, normal ROM Neurologic: Alert and oriented x 4.  GU: Neg CVAT.  PELVIC EXAM:    Dilation: 1 Effacement (%): 70 Station: -1 Exam by:: Artelia LarocheM. Orel Cooler, CNM  FHT:  Baseline 140 , moderate variability, accelerations present, no decelerations Contractions: q 2 mins Irregular   Labs: O/Positive/-- (02/20 1347) Results for orders placed or performed during the hospital encounter of 05/23/17 (from the past 24 hour(s))  Urinalysis, Routine w reflex microscopic     Status: Abnormal   Collection Time: 05/23/17  1:35 AM  Result Value Ref Range   Color, Urine YELLOW YELLOW   APPearance CLEAR CLEAR   Specific Gravity, Urine 1.010 1.005 - 1.030   pH 7.0 5.0 - 8.0   Glucose, UA NEGATIVE NEGATIVE mg/dL   Hgb urine dipstick TRACE (A) NEGATIVE   Bilirubin Urine SMALL (A) NEGATIVE   Ketones, ur >80 (A) NEGATIVE mg/dL   Protein, ur NEGATIVE NEGATIVE mg/dL   Nitrite NEGATIVE NEGATIVE   Leukocytes, UA MODERATE (A) NEGATIVE  Urinalysis, Microscopic (reflex)     Status: Abnormal   Collection Time: 05/23/17  1:35 AM  Result Value Ref Range   RBC / HPF 0-5 0 - 5 RBC/hpf   WBC, UA 6-30 0 - 5 WBC/hpf   Bacteria, UA FEW (A) NONE SEEN   Squamous Epithelial / LPF 0-5 (A) NONE SEEN   Mucus PRESENT    Amorphous Crystal PRESENT     Imaging:    MAU Course/MDM: I have ordered labs and reviewed results. Urine has some leuks but few bacteria NST reviewed and is reactive. UCs are frequent, mild Consult Dr Earlene Plateravis with presentation, exam findings and test results.  Treatments in MAU included Procardia x 3. Then BP a bit too low so held fourth dose.  IV fluids given x 2 liters First two hours no lessening of UCs so we observed a while longer and UCs mostly stopped. .   No change in cervix over time  Assessment: Single IUP at 6736w3d Preterm uterine irritability No change in cervix  Plan: Discharge home Preterm Labor precautions  and fetal kick counts Follow up in Office for prenatal visits and recheck  Encouraged to return here or to other Urgent Care/ED if she develops worsening of symptoms, increase in pain, fever, or other concerning symptoms.   Pt stable at time of discharge.  Wynelle BourgeoisMarie Admir Candelas CNM, MSN Certified Nurse-Midwife 05/23/2017 12:52 AM

## 2017-06-04 ENCOUNTER — Ambulatory Visit (INDEPENDENT_AMBULATORY_CARE_PROVIDER_SITE_OTHER): Payer: Medicaid Other | Admitting: Obstetrics & Gynecology

## 2017-06-04 ENCOUNTER — Other Ambulatory Visit: Payer: Self-pay | Admitting: Obstetrics and Gynecology

## 2017-06-04 ENCOUNTER — Encounter (HOSPITAL_COMMUNITY): Payer: Self-pay

## 2017-06-04 VITALS — BP 105/66 | HR 99 | Wt 115.0 lb

## 2017-06-04 DIAGNOSIS — O0993 Supervision of high risk pregnancy, unspecified, third trimester: Secondary | ICD-10-CM | POA: Diagnosis not present

## 2017-06-04 DIAGNOSIS — Z8759 Personal history of other complications of pregnancy, childbirth and the puerperium: Secondary | ICD-10-CM

## 2017-06-04 DIAGNOSIS — Z98891 History of uterine scar from previous surgery: Secondary | ICD-10-CM

## 2017-06-04 DIAGNOSIS — O0990 Supervision of high risk pregnancy, unspecified, unspecified trimester: Secondary | ICD-10-CM

## 2017-06-04 NOTE — Progress Notes (Signed)
   PRENATAL VISIT NOTE  Subjective:  Jena GaussShaniah Corey is a 22 y.o. G2P0101 at 5693w1d being seen today for ongoing prenatal care.  She is currently monitored for the following issues for this high-risk pregnancy and has Supervision of high risk pregnancy, antepartum; History of placenta abruption; Insufficient prenatal care; History of C-section; Vitamin D deficiency; Anemia affecting pregnancy in third trimester; and Cystic fibrosis carrier on their problem list.  Patient reports no complaints.  Contractions: Irregular.  .  Movement: Present. Denies leaking of fluid.   The following portions of the patient's history were reviewed and updated as appropriate: allergies, current medications, past family history, past medical history, past social history, past surgical history and problem list. Problem list updated.  Objective:   Vitals:   06/04/17 1344  BP: 105/66  Pulse: 99  Weight: 115 lb (52.2 kg)    Fetal Status:     Movement: Present     General:  Alert, oriented and cooperative. Patient is in no acute distress.  Skin: Skin is warm and dry. No rash noted.   Cardiovascular: Normal heart rate noted  Respiratory: Normal respiratory effort, no problems with respiration noted  Abdomen: Soft, gravid, appropriate for gestational age.  Pain/Pressure: Present     Pelvic: Cervical exam deferred        Extremities: Normal range of motion.     Mental Status: Normal mood and affect. Normal behavior. Normal judgment and thought content.   Assessment and Plan:  Pregnancy: G2P0101 at 6393w1d  1. History of C-section Repeat at 39 weeks 06/17/17  2. Supervision of high risk pregnancy, antepartum S<d needs US f/u  3. History of placenta abruption 32 weeks  Term labor symptoms and general obstetric precautions including but not limited to vaginal bleeding, contractions, leaking of fluid and fetal movement were reviewed in detail with the patient. Please refer to After Visit Summary for other  counseling recommendations.  1 week f/u No future appointments.  Scheryl DarterJames Arnold, MD

## 2017-06-04 NOTE — Progress Notes (Signed)
Pt states increase pain in legs.

## 2017-06-04 NOTE — Patient Instructions (Signed)
Cesarean Delivery Cesarean birth, or cesarean delivery, is the surgical delivery of a baby through an incision in the abdomen and the uterus. This may be referred to as a C-section. This procedure may be scheduled ahead of time, or it may be done in an emergency situation. Tell a health care provider about:  Any allergies you have.  All medicines you are taking, including vitamins, herbs, eye drops, creams, and over-the-counter medicines.  Any problems you or family members have had with anesthetic medicines.  Any blood disorders you have.  Any surgeries you have had.  Any medical conditions you have.  Whether you or any members of your family have a history of deep vein thrombosis (DVT) or pulmonary embolism (PE). What are the risks? Generally, this is a safe procedure. However, problems may occur, including:  Infection.  Bleeding.  Allergic reactions to medicines.  Damage to other structures or organs.  Blood clots.  Injury to your baby.  What happens before the procedure?  Follow instructions from your health care provider about eating or drinking restrictions.  Follow instructions from your health care provider about bathing before your procedure to help reduce your risk of infection.  If you know that you are going to have a cesarean delivery, do not shave your pubic area. Shaving before the procedure may increase your risk of infection.  Ask your health care provider about: ? Changing or stopping your regular medicines. This is especially important if you are taking diabetes medicines or blood thinners. ? Your pain management plan. This is especially important if you plan to breastfeed your baby. ? How long you will be in the hospital after the procedure. ? Any concerns you may have about receiving blood products if you need them during the procedure. ? Cord blood banking, if you plan to collect your baby's umbilical cord blood.  You may also want to ask your  health care provider: ? Whether you will be able to hold or breastfeed your baby while you are still in the operating room. ? Whether your baby can stay with you immediately after the procedure and during your recovery. ? Whether a family member or a person of your choice can go with you into the operating room and stay with you during the procedure, immediately after the procedure, and during your recovery.  Plan to have someone drive you home when you are discharged from the hospital. What happens during the procedure?  Fetal monitors will be placed on your abdomen to monitor your heart rate and your baby's heart rate.  Depending on the reason for your cesarean delivery, you may have a physical exam or additional testing, such as an ultrasound.  An IV tube will be inserted into one of your veins.  You may have your blood or urine tested.  You will be given antibiotic medicine to help prevent infection.  You may be given a special warming gown to wear to keep your temperature stable.  Hair may be removed from your pubic area.  The skin of your pubic area and lower abdomen will be cleaned with a germ-killing solution (antiseptic).  A catheter may be inserted into your bladder through your urethra. This drains your urine during the procedure.  You may be given one or more of the following: ? A medicine to numb the area (local anesthetic). ? A medicine to make you fall asleep (general anesthetic). ? A medicine (regional anesthetic) that is injected into your back or through a small   thin tube placed in your back (spinal anesthetic or epidural anesthetic). This numbs everything below the injection site and allows you to stay awake during your procedure. If this makes you feel nauseous, tell your health care provider. Medicines will be available to help reduce any nausea you may feel.  An incision will be made in your abdomen, and then in your uterus.  If you are awake during your  procedure, you may feel tugging and pulling in your abdomen, but you should not feel pain. If you feel pain, tell your health care provider immediately.  Your baby will be removed from your uterus. You may feel more pressure or pushing while this happens.  Immediately after birth, your baby will be dried and kept warm. You may be able to hold and breastfeed your baby. The umbilical cord may be clamped and cut during this time.  Your placenta will be removed from your uterus.  Your incisions will be closed with stitches (sutures). Staples, skin glue, or adhesive strips may also be applied to the incision in your abdomen.  Bandages (dressings) will be placed over the incision in your abdomen. The procedure may vary among health care providers and hospitals. What happens after the procedure?  Your blood pressure, heart rate, breathing rate, and blood oxygen level will be monitored often until the medicines you were given have worn off.  You may continue to receive fluids and medicines through an IV tube.  You will have some pain. Medicines will be available to help control your pain.  To help prevent blood clots: ? You may be given medicines. ? You may have to wear compression stockings or devices. ? You will be encouraged to walk around when you are able.  Hospital staff will encourage and support bonding with your baby. Your hospital may allow you and your baby to stay in the same room (rooming in) during your hospital stay to encourage successful breastfeeding.  You may be encouraged to cough and breathe deeply often. This helps to prevent lung problems.  If you have a catheter draining your urine, it will be removed as soon as possible after your procedure. This information is not intended to replace advice given to you by your health care provider. Make sure you discuss any questions you have with your health care provider. Document Released: 02/18/2005 Document Revised: 07/27/2015  Document Reviewed: 11/29/2014 Elsevier Interactive Patient Education  2018 [employer]  

## 2017-06-07 ENCOUNTER — Inpatient Hospital Stay (HOSPITAL_COMMUNITY): Payer: Medicaid Other | Admitting: Anesthesiology

## 2017-06-07 ENCOUNTER — Encounter (HOSPITAL_COMMUNITY): Payer: Self-pay | Admitting: *Deleted

## 2017-06-07 ENCOUNTER — Inpatient Hospital Stay (HOSPITAL_COMMUNITY)
Admission: AD | Admit: 2017-06-07 | Discharge: 2017-06-10 | DRG: 788 | Disposition: A | Discharge status: Home / Self Care | Payer: Medicaid Other | Source: Ambulatory Visit | Attending: Obstetrics and Gynecology | Admitting: Obstetrics and Gynecology

## 2017-06-07 ENCOUNTER — Encounter (HOSPITAL_COMMUNITY)
Admission: AD | Disposition: A | Discharge status: Home / Self Care | Payer: Self-pay | Source: Ambulatory Visit | Attending: Obstetrics and Gynecology

## 2017-06-07 DIAGNOSIS — Z9889 Other specified postprocedural states: Secondary | ICD-10-CM

## 2017-06-07 DIAGNOSIS — D649 Anemia, unspecified: Secondary | ICD-10-CM | POA: Diagnosis present

## 2017-06-07 DIAGNOSIS — O9902 Anemia complicating childbirth: Secondary | ICD-10-CM | POA: Diagnosis present

## 2017-06-07 DIAGNOSIS — O34211 Maternal care for low transverse scar from previous cesarean delivery: Secondary | ICD-10-CM | POA: Diagnosis present

## 2017-06-07 DIAGNOSIS — Z141 Cystic fibrosis carrier: Secondary | ICD-10-CM

## 2017-06-07 DIAGNOSIS — Z3A37 37 weeks gestation of pregnancy: Secondary | ICD-10-CM

## 2017-06-07 HISTORY — DX: Cystic fibrosis carrier: Z14.1

## 2017-06-07 HISTORY — DX: Other specified postprocedural states: Z98.890

## 2017-06-07 LAB — CBC
HCT: 31.2 % — ABNORMAL LOW (ref 36.0–46.0)
Hemoglobin: 10.5 g/dL — ABNORMAL LOW (ref 12.0–15.0)
MCH: 28.4 pg (ref 26.0–34.0)
MCHC: 33.7 g/dL (ref 30.0–36.0)
MCV: 84.3 fL (ref 78.0–100.0)
PLATELETS: 328 10*3/uL (ref 150–400)
RBC: 3.7 MIL/uL — ABNORMAL LOW (ref 3.87–5.11)
RDW: 13.3 % (ref 11.5–15.5)
WBC: 12 10*3/uL — AB (ref 4.0–10.5)

## 2017-06-07 LAB — TYPE AND SCREEN
ABO/RH(D): O POS
ANTIBODY SCREEN: NEGATIVE

## 2017-06-07 LAB — ABO/RH: ABO/RH(D): O POS

## 2017-06-07 SURGERY — Surgical Case
Anesthesia: Spinal | Site: Abdomen | Wound class: Clean Contaminated

## 2017-06-07 MED ORDER — OXYTOCIN 10 UNIT/ML IJ SOLN
INTRAMUSCULAR | Status: AC
  Filled 2017-06-07: qty 4

## 2017-06-07 MED ORDER — IBUPROFEN 800 MG PO TABS
800.0000 mg | ORAL_TABLET | Freq: Three times a day (TID) | ORAL | Status: DC
  Administered 2017-06-08 – 2017-06-10 (×6): 800 mg via ORAL
  Filled 2017-06-07 (×8): qty 1

## 2017-06-07 MED ORDER — NALOXONE HCL 4 MG/10ML IJ SOLN
1.0000 ug/kg/h | INTRAMUSCULAR | Status: DC | PRN
  Filled 2017-06-07: qty 5

## 2017-06-07 MED ORDER — MORPHINE SULFATE (PF) 0.5 MG/ML IJ SOLN
INTRAMUSCULAR | Status: DC | PRN
  Administered 2017-06-07: 20 ug via INTRATHECAL

## 2017-06-07 MED ORDER — ACETAMINOPHEN 500 MG PO TABS
1000.0000 mg | ORAL_TABLET | Freq: Four times a day (QID) | ORAL | Status: AC
  Administered 2017-06-08 (×2): 1000 mg via ORAL
  Filled 2017-06-07 (×4): qty 2

## 2017-06-07 MED ORDER — PHENYLEPHRINE 8 MG IN D5W 100 ML (0.08MG/ML) PREMIX OPTIME
INJECTION | INTRAVENOUS | Status: AC
  Filled 2017-06-07: qty 100

## 2017-06-07 MED ORDER — BUPIVACAINE IN DEXTROSE 0.75-8.25 % IT SOLN
INTRATHECAL | Status: DC | PRN
  Administered 2017-06-07: 1.3 mL via INTRATHECAL

## 2017-06-07 MED ORDER — ONDANSETRON HCL 4 MG/2ML IJ SOLN
INTRAMUSCULAR | Status: DC | PRN
  Administered 2017-06-07: 4 mg via INTRAVENOUS

## 2017-06-07 MED ORDER — FENTANYL CITRATE (PF) 100 MCG/2ML IJ SOLN
INTRAMUSCULAR | Status: AC
  Filled 2017-06-07: qty 2

## 2017-06-07 MED ORDER — NALOXONE HCL 0.4 MG/ML IJ SOLN: 0.4000 mg | INTRAMUSCULAR | Status: DC | PRN

## 2017-06-07 MED ORDER — SCOPOLAMINE 1 MG/3DAYS TD PT72
1.0000 | MEDICATED_PATCH | Freq: Once | TRANSDERMAL | Status: DC
  Filled 2017-06-07: qty 1

## 2017-06-07 MED ORDER — PROMETHAZINE HCL 25 MG/ML IJ SOLN
INTRAMUSCULAR | Status: AC
  Administered 2017-06-07: 6.25 mg via INTRAVENOUS
  Filled 2017-06-07: qty 1

## 2017-06-07 MED ORDER — KETOROLAC TROMETHAMINE 30 MG/ML IJ SOLN
INTRAMUSCULAR | Status: DC | PRN
  Administered 2017-06-07: 30 mg via INTRAVENOUS

## 2017-06-07 MED ORDER — ACETAMINOPHEN 325 MG PO TABS: 650.0000 mg | ORAL_TABLET | ORAL | Status: DC | PRN

## 2017-06-07 MED ORDER — KETOROLAC TROMETHAMINE 30 MG/ML IJ SOLN: 30.0000 mg | Freq: Four times a day (QID) | INTRAMUSCULAR | Status: AC | PRN

## 2017-06-07 MED ORDER — PROMETHAZINE HCL 25 MG/ML IJ SOLN
6.2500 mg | INTRAMUSCULAR | Status: DC | PRN
  Administered 2017-06-07: 6.25 mg via INTRAVENOUS

## 2017-06-07 MED ORDER — FAMOTIDINE IN NACL 20-0.9 MG/50ML-% IV SOLN
20.0000 mg | Freq: Once | INTRAVENOUS | Status: AC
  Administered 2017-06-07: 20 mg via INTRAVENOUS
  Filled 2017-06-07: qty 50

## 2017-06-07 MED ORDER — OXYCODONE-ACETAMINOPHEN 5-325 MG PO TABS
1.0000 | ORAL_TABLET | ORAL | Status: DC | PRN
  Administered 2017-06-08: 1 via ORAL
  Filled 2017-06-07: qty 1

## 2017-06-07 MED ORDER — MORPHINE SULFATE (PF) 0.5 MG/ML IJ SOLN
INTRAMUSCULAR | Status: AC
  Filled 2017-06-07: qty 10

## 2017-06-07 MED ORDER — COCONUT OIL OIL
1.0000 "application " | TOPICAL_OIL | Status: DC | PRN
  Filled 2017-06-07: qty 120

## 2017-06-07 MED ORDER — MENTHOL 3 MG MT LOZG: 1.0000 | LOZENGE | OROMUCOSAL | Status: DC | PRN

## 2017-06-07 MED ORDER — DIPHENHYDRAMINE HCL 25 MG PO CAPS: 25.0000 mg | ORAL_CAPSULE | Freq: Four times a day (QID) | ORAL | Status: DC | PRN

## 2017-06-07 MED ORDER — SIMETHICONE 80 MG PO CHEW
80.0000 mg | CHEWABLE_TABLET | Freq: Three times a day (TID) | ORAL | Status: DC
  Administered 2017-06-08 – 2017-06-10 (×3): 80 mg via ORAL
  Filled 2017-06-07 (×7): qty 1

## 2017-06-07 MED ORDER — TETANUS-DIPHTH-ACELL PERTUSSIS 5-2.5-18.5 LF-MCG/0.5 IM SUSP: 0.5000 mL | Freq: Once | INTRAMUSCULAR | Status: DC

## 2017-06-07 MED ORDER — SODIUM CHLORIDE 0.9% FLUSH: 3.0000 mL | INTRAVENOUS | Status: DC | PRN

## 2017-06-07 MED ORDER — NALBUPHINE HCL 10 MG/ML IJ SOLN: 5.0000 mg | Freq: Once | INTRAMUSCULAR | Status: DC | PRN

## 2017-06-07 MED ORDER — HYDROMORPHONE HCL 1 MG/ML IJ SOLN
0.2500 mg | INTRAMUSCULAR | Status: DC | PRN
  Administered 2017-06-07: 0.25 mg via INTRAVENOUS
  Administered 2017-06-07: 0.5 mg via INTRAVENOUS

## 2017-06-07 MED ORDER — DIPHENHYDRAMINE HCL 50 MG/ML IJ SOLN: 12.5000 mg | INTRAMUSCULAR | Status: DC | PRN

## 2017-06-07 MED ORDER — SOD CITRATE-CITRIC ACID 500-334 MG/5ML PO SOLN
30.0000 mL | Freq: Once | ORAL | Status: AC
  Administered 2017-06-07: 30 mL via ORAL
  Filled 2017-06-07: qty 15

## 2017-06-07 MED ORDER — SIMETHICONE 80 MG PO CHEW: 80.0000 mg | CHEWABLE_TABLET | ORAL | Status: DC | PRN

## 2017-06-07 MED ORDER — PHENYLEPHRINE 40 MCG/ML (10ML) SYRINGE FOR IV PUSH (FOR BLOOD PRESSURE SUPPORT)
PREFILLED_SYRINGE | INTRAVENOUS | Status: AC
  Filled 2017-06-07: qty 10

## 2017-06-07 MED ORDER — CEFAZOLIN SODIUM-DEXTROSE 2-4 GM/100ML-% IV SOLN
2.0000 g | INTRAVENOUS | Status: AC
  Administered 2017-06-07: 2 g via INTRAVENOUS
  Filled 2017-06-07: qty 100

## 2017-06-07 MED ORDER — LACTATED RINGERS IV BOLUS
1000.0000 mL | Freq: Once | INTRAVENOUS | Status: AC
  Administered 2017-06-07: 1000 mL via INTRAVENOUS

## 2017-06-07 MED ORDER — NALBUPHINE HCL 10 MG/ML IJ SOLN: 5.0000 mg | INTRAMUSCULAR | Status: DC | PRN

## 2017-06-07 MED ORDER — MEPERIDINE HCL 25 MG/ML IJ SOLN: 6.2500 mg | INTRAMUSCULAR | Status: DC | PRN

## 2017-06-07 MED ORDER — IBUPROFEN 800 MG PO TABS: 800.0000 mg | ORAL_TABLET | Freq: Three times a day (TID) | ORAL | Status: DC

## 2017-06-07 MED ORDER — DIPHENHYDRAMINE HCL 25 MG PO CAPS: 25.0000 mg | ORAL_CAPSULE | ORAL | Status: DC | PRN

## 2017-06-07 MED ORDER — DEXAMETHASONE SODIUM PHOSPHATE 4 MG/ML IJ SOLN
INTRAMUSCULAR | Status: DC | PRN
  Administered 2017-06-07: 4 mg via INTRAVENOUS

## 2017-06-07 MED ORDER — ONDANSETRON HCL 4 MG/2ML IJ SOLN
INTRAMUSCULAR | Status: AC
  Filled 2017-06-07: qty 2

## 2017-06-07 MED ORDER — WITCH HAZEL-GLYCERIN EX PADS: 1.0000 "application " | MEDICATED_PAD | CUTANEOUS | Status: DC | PRN

## 2017-06-07 MED ORDER — BUPIVACAINE IN DEXTROSE 0.75-8.25 % IT SOLN
INTRATHECAL | Status: AC
  Filled 2017-06-07: qty 2

## 2017-06-07 MED ORDER — KETOROLAC TROMETHAMINE 30 MG/ML IJ SOLN
30.0000 mg | Freq: Four times a day (QID) | INTRAMUSCULAR | Status: AC
  Administered 2017-06-07 – 2017-06-08 (×2): 30 mg via INTRAVENOUS
  Filled 2017-06-07 (×2): qty 1

## 2017-06-07 MED ORDER — ONDANSETRON HCL 4 MG/2ML IJ SOLN: 4.0000 mg | Freq: Three times a day (TID) | INTRAMUSCULAR | Status: DC | PRN

## 2017-06-07 MED ORDER — PRENATAL MULTIVITAMIN CH
1.0000 | ORAL_TABLET | Freq: Every day | ORAL | Status: DC
  Administered 2017-06-08 – 2017-06-10 (×3): 1 via ORAL
  Filled 2017-06-07 (×3): qty 1

## 2017-06-07 MED ORDER — SENNOSIDES-DOCUSATE SODIUM 8.6-50 MG PO TABS
2.0000 | ORAL_TABLET | ORAL | Status: DC
  Administered 2017-06-07 – 2017-06-09 (×2): 2 via ORAL
  Filled 2017-06-07 (×4): qty 2

## 2017-06-07 MED ORDER — LACTATED RINGERS IV SOLN
INTRAVENOUS | Status: DC
  Administered 2017-06-07 (×2): via INTRAVENOUS

## 2017-06-07 MED ORDER — FENTANYL CITRATE (PF) 100 MCG/2ML IJ SOLN
INTRAMUSCULAR | Status: DC | PRN
  Administered 2017-06-07: 20 ug via INTRATHECAL

## 2017-06-07 MED ORDER — OXYCODONE-ACETAMINOPHEN 5-325 MG PO TABS
2.0000 | ORAL_TABLET | ORAL | Status: DC | PRN
  Administered 2017-06-09 – 2017-06-10 (×5): 2 via ORAL
  Filled 2017-06-07 (×5): qty 2

## 2017-06-07 MED ORDER — LACTATED RINGERS IV SOLN
INTRAVENOUS | Status: DC
  Administered 2017-06-07: 23:00:00 via INTRAVENOUS

## 2017-06-07 MED ORDER — SODIUM CHLORIDE 0.9% FLUSH
INTRAVENOUS | Status: AC
  Filled 2017-06-07: qty 3

## 2017-06-07 MED ORDER — PHENYLEPHRINE 8 MG IN D5W 100 ML (0.08MG/ML) PREMIX OPTIME
INJECTION | INTRAVENOUS | Status: DC | PRN
  Administered 2017-06-07: 60 ug/min via INTRAVENOUS

## 2017-06-07 MED ORDER — HYDROMORPHONE HCL 1 MG/ML IJ SOLN
INTRAMUSCULAR | Status: AC
  Filled 2017-06-07: qty 0.5

## 2017-06-07 MED ORDER — SIMETHICONE 80 MG PO CHEW
80.0000 mg | CHEWABLE_TABLET | ORAL | Status: DC
  Administered 2017-06-07 – 2017-06-09 (×3): 80 mg via ORAL
  Filled 2017-06-07 (×2): qty 1

## 2017-06-07 MED ORDER — ENOXAPARIN SODIUM 40 MG/0.4ML ~~LOC~~ SOLN
40.0000 mg | SUBCUTANEOUS | Status: DC
  Administered 2017-06-08 – 2017-06-09 (×2): 40 mg via SUBCUTANEOUS
  Filled 2017-06-07 (×5): qty 0.4

## 2017-06-07 MED ORDER — OXYTOCIN 10 UNIT/ML IJ SOLN
INTRAMUSCULAR | Status: DC | PRN
  Administered 2017-06-07: 40 [IU] via INTRAVENOUS

## 2017-06-07 MED ORDER — DEXAMETHASONE SODIUM PHOSPHATE 4 MG/ML IJ SOLN
INTRAMUSCULAR | Status: AC
  Filled 2017-06-07: qty 1

## 2017-06-07 MED ORDER — KETOROLAC TROMETHAMINE 30 MG/ML IJ SOLN: 30.0000 mg | Freq: Once | INTRAMUSCULAR | Status: DC | PRN

## 2017-06-07 MED ORDER — LACTATED RINGERS IV SOLN
INTRAVENOUS | Status: DC | PRN
  Administered 2017-06-07: 14:00:00 via INTRAVENOUS

## 2017-06-07 MED ORDER — ZOLPIDEM TARTRATE 5 MG PO TABS: 5.0000 mg | ORAL_TABLET | Freq: Every evening | ORAL | Status: DC | PRN

## 2017-06-07 MED ORDER — DIBUCAINE 1 % RE OINT: 1.0000 "application " | TOPICAL_OINTMENT | RECTAL | Status: DC | PRN

## 2017-06-07 MED ORDER — OXYTOCIN 40 UNITS IN LACTATED RINGERS INFUSION - SIMPLE MED: 2.5000 [IU]/h | INTRAVENOUS | Status: AC

## 2017-06-07 SURGICAL SUPPLY — 32 items
BENZOIN TINCTURE PRP APPL 2/3 (GAUZE/BANDAGES/DRESSINGS) ×3 IMPLANT
CHLORAPREP W/TINT 26ML (MISCELLANEOUS) ×3 IMPLANT
CLAMP CORD UMBIL (MISCELLANEOUS) IMPLANT
CLOSURE WOUND 1/2 X4 (GAUZE/BANDAGES/DRESSINGS) ×1
CLOTH BEACON ORANGE TIMEOUT ST (SAFETY) ×3 IMPLANT
DRSG OPSITE POSTOP 4X10 (GAUZE/BANDAGES/DRESSINGS) ×3 IMPLANT
ELECT REM PT RETURN 9FT ADLT (ELECTROSURGICAL) ×3
ELECTRODE REM PT RTRN 9FT ADLT (ELECTROSURGICAL) ×1 IMPLANT
EXTRACTOR VACUUM M CUP 4 TUBE (SUCTIONS) IMPLANT
EXTRACTOR VACUUM M CUP 4' TUBE (SUCTIONS)
GLOVE BIOGEL PI IND STRL 7.0 (GLOVE) ×2 IMPLANT
GLOVE BIOGEL PI IND STRL 7.5 (GLOVE) ×2 IMPLANT
GLOVE BIOGEL PI INDICATOR 7.0 (GLOVE) ×4
GLOVE BIOGEL PI INDICATOR 7.5 (GLOVE) ×4
GLOVE ECLIPSE 7.5 STRL STRAW (GLOVE) ×3 IMPLANT
GOWN STRL REUS W/TWL LRG LVL3 (GOWN DISPOSABLE) ×9 IMPLANT
KIT ABG SYR 3ML LUER SLIP (SYRINGE) IMPLANT
NEEDLE HYPO 25X5/8 SAFETYGLIDE (NEEDLE) IMPLANT
NS IRRIG 1000ML POUR BTL (IV SOLUTION) ×3 IMPLANT
PACK C SECTION WH (CUSTOM PROCEDURE TRAY) ×3 IMPLANT
PAD OB MATERNITY 4.3X12.25 (PERSONAL CARE ITEMS) ×3 IMPLANT
PENCIL SMOKE EVAC W/HOLSTER (ELECTROSURGICAL) ×3 IMPLANT
RTRCTR C-SECT PINK 25CM LRG (MISCELLANEOUS) ×3 IMPLANT
STRIP CLOSURE SKIN 1/2X4 (GAUZE/BANDAGES/DRESSINGS) ×2 IMPLANT
SUT VIC AB 0 CT1 36 (SUTURE) ×3 IMPLANT
SUT VIC AB 0 CTX 36 (SUTURE) ×4
SUT VIC AB 0 CTX36XBRD ANBCTRL (SUTURE) ×2 IMPLANT
SUT VIC AB 2-0 CT1 27 (SUTURE) ×2
SUT VIC AB 2-0 CT1 TAPERPNT 27 (SUTURE) ×1 IMPLANT
SUT VIC AB 4-0 KS 27 (SUTURE) ×3 IMPLANT
TOWEL OR 17X24 6PK STRL BLUE (TOWEL DISPOSABLE) ×3 IMPLANT
TRAY FOLEY BAG SILVER LF 14FR (SET/KITS/TRAYS/PACK) ×3 IMPLANT

## 2017-06-07 NOTE — MAU Note (Signed)
Pt C/O uc's since 0400, has become more intense.  Denies bleeding or LOF.  Reports good fetal movement.

## 2017-06-07 NOTE — H&P (Addendum)
Annette GaussShaniah Rodriguez is a 22 y.o. female G2P1001 @ 37.4 presenting for reg contractions since 0400. Prev c/s for abruption for repeat. Does not desire attemped TOL. OB History    Gravida  2   Para  1   Term      Preterm  1   AB      Living  1     SAB      TAB      Ectopic      Multiple      Live Births  1          Past Medical History:  Diagnosis Date  . Anemia   . Cystic fibrosis carrier    Past Surgical History:  Procedure Laterality Date  . CESAREAN SECTION  05/11/2015   Family History: family history is not on file. Social History:  reports that she has never smoked. She has never used smokeless tobacco. She reports that she does not drink alcohol or use drugs.     Maternal Diabetes: No Genetic Screening: Normal Maternal Ultrasounds/Referrals: Normal Fetal Ultrasounds or other Referrals:  None Maternal Substance Abuse:  No Significant Maternal Medications:  None Significant Maternal Lab Results:  None Other Comments:  None  Review of Systems  Constitutional: Negative.   HENT: Negative.   Eyes: Negative.   Respiratory: Negative.   Cardiovascular: Negative.   Gastrointestinal: Positive for abdominal pain.  Genitourinary: Negative.   Musculoskeletal: Negative.   Skin: Negative.   Neurological: Negative.   Endo/Heme/Allergies: Negative.   Psychiatric/Behavioral: Negative.    Maternal Medical History:  Reason for admission: Contractions.   Contractions: Onset was 6-12 hours ago.   Frequency: regular.   Perceived severity is moderate.    Fetal activity: Perceived fetal activity is normal.   Last perceived fetal movement was within the past hour.    Prenatal complications: no prenatal complications Prenatal Complications - Diabetes: none.    Dilation: 1.5 Effacement (%): 70 Station: -2 Exam by:: Dorrene GermanJ. Lowe RN Blood pressure 111/64, pulse 95, temperature 98.3 F (36.8 C), temperature source Oral, resp. rate 18, height 4\' 11"  (1.499 m), weight  111 lb (50.3 kg), last menstrual period 09/17/2016. Maternal Exam:  Uterine Assessment: Contraction strength is moderate.  Contraction frequency is regular.   Abdomen: Surgical scars: low transverse.   Fetal presentation: vertex  Introitus: Normal vulva. Normal vagina.  Ferning test: not done.  Nitrazine test: not done. Amniotic fluid character: not assessed.  Pelvis: Desires repeat c/s    Fetal Exam Fetal Monitor Review: Mode: ultrasound.   Baseline rate: 130.  Variability: moderate (6-25 bpm).   Pattern: accelerations present and no decelerations.    Fetal State Assessment: Category I - tracings are normal.     Physical Exam  Constitutional: She is oriented to person, place, and time. She appears well-developed and well-nourished.  HENT:  Head: Normocephalic.  Cardiovascular: Normal rate, regular rhythm, normal heart sounds and intact distal pulses.  Respiratory: Effort normal and breath sounds normal.  GI: Soft. Bowel sounds are normal.  Genitourinary: Vagina normal and uterus normal. Rectal exam shows guaiac positive stool.  Musculoskeletal: Normal range of motion.  Neurological: She is alert and oriented to person, place, and time. She has normal reflexes.  Skin: Skin is warm and dry.  Psychiatric: She has a normal mood and affect. Her behavior is normal. Judgment and thought content normal.    Prenatal labs: ABO, Rh: O/Positive/-- (02/20 1347) Antibody: Negative (02/20 1347) Rubella: 3.02 (02/20 1347) RPR: Non Reactive (02/26  1108)  HBsAg: Negative (02/20 1347)  HIV: Non Reactive (02/26 1108)  GBS:     Assessment/Plan: Early labor at 37.4 for repeat c/s Consult Dr. Teressa Lower 06/07/2017, 10:57 AM   The risks of cesarean section discussed with the patient included but were not limited to: bleeding which may require transfusion or reoperation; infection which may require antibiotics; injury to bowel, bladder, ureters or other surrounding organs;  injury to the fetus; need for additional procedures including hysterectomy in the event of a life-threatening hemorrhage; placental abnormalities wth subsequent pregnancies, incisional problems, thromboembolic phenomenon and other postoperative/anesthesia complications. The patient concurred with the proposed plan, giving informed written consent for the procedure.   Anesthesia and OR aware. Preoperative prophylactic antibiotics and SCDs ordered on call to the OR.  To OR when ready.  Annette Rodriguez L. Alysia Penna, MD

## 2017-06-07 NOTE — Anesthesia Procedure Notes (Signed)
Spinal  Patient location during procedure: OR Start time: 06/07/2017 1:44 PM End time: 06/07/2017 1:48 PM Staffing Anesthesiologist: Leilani AbleHatchett, Jolonda Gomm, MD Performed: anesthesiologist  Preanesthetic Checklist Completed: patient identified, site marked, surgical consent, pre-op evaluation, timeout performed, IV checked, risks and benefits discussed and monitors and equipment checked Spinal Block Patient position: sitting Prep: site prepped and draped and DuraPrep Patient monitoring: continuous pulse ox and blood pressure Approach: midline Location: L3-4 Needle Needle type: Pencan  Needle gauge: 24 G Needle insertion depth: 5 cm Assessment Sensory level: T4

## 2017-06-07 NOTE — Consult Note (Signed)
Neonatology Note:   Attendance at C-section:    I was asked by Dr. Ervin to attend this repeat C/S at 37 4/7 weeks due to onset of labor. The mother is a G2P1 O pos, GBS unknown with CF carrier status. ROM at delivery, fluid clear. Infant vigorous with good spontaneous cry and tone. Delayed cord clamping was done. Needed only minimal bulb suctioning. Ap 8/9. Lungs clear to ausc in DR. Infant is able to remain with her mother for skin to skin time under nursing supervision. Transferred to the care of Pediatrician.   Veronda Gabor C. Jaceon Heiberger, MD   

## 2017-06-07 NOTE — Op Note (Signed)
Cesarean Section Procedure Note  06/07/2017  2:59 PM  PATIENT:  Annette Rodriguez  22 y.o. female  PRE-OPERATIVE DIAGNOSIS:  Repeat cesarean section   POST-OPERATIVE DIAGNOSIS:  Repeat cesarean section   PROCEDURE:  Procedure(s): CESAREAN SECTION (N/A)  SURGEON:  Surgeon(s) and Role:    * Hermina StaggersErvin, Demara Lover L, MD - Primary  ASSISTANTS: none   ANESTHESIA:   spinal  EBL:  Total I/O In: 1900 [I.V.:1900] Out: 651 [Urine:150; Blood:501]  BLOOD ADMINISTERED:none  DRAINS: none   LOCAL MEDICATIONS USED:  NONE  SPECIMEN:  No Specimen  DISPOSITION OF SPECIMEN:  N/A   Procedure Details   The patient was seen in the Holding Room. The risks, benefits, complications, treatment options, and expected outcomes were discussed with the patient.  The patient concurred with the proposed plan, giving informed consent.  The site of surgery properly noted/marked. The patient was taken to Operating Room # 9, identified as Annette Rodriguez and the procedure verified as C-Section Delivery. A Time Out was held and the above information confirmed.  After induction of anesthesia, the patient was draped and prepped in the usual sterile manner. A Pfannenstiel incision was made and carried down through the subcutaneous tissue to the fascia. Fascial incision was made and extended transversely. The fascia was separated from the underlying rectus tissue superiorly and inferiorly. The peritoneum was identified and entered. Peritoneal incision was extended longitudinally. The utero-vesical peritoneal reflection was incised transversely and the bladder flap was bluntly freed from the lower uterine segment. A low transverse uterine incision was made. Delivered from cephalic presentation was a (pending) gram Female with Apgar scores of Pending at one minute and Pending at five minutes. After the umbilical cord was clamped and cut cord blood was obtained for evaluation. The placenta was removed intact and appeared normal. The  uterine outline, tubes and ovaries appeared normal. The uterine incision was closed with running locked sutures of Chromic in a two layer fashion.Bladder flap was closed with 3/0 Vicryl. Hemostasis was observed. Lavage was carried out until clear. Abd muscles and perineum was closed with 3/0 Vicryl. The fascia was then reapproximated with running sutures of Vicryl. The skin was reapproximated with Vicryl.  Instrument, sponge, and needle counts were correct prior the abdominal closure and at the conclusion of the case.   Complications:  None; patient tolerated the procedure well.  COUNTS:  YES  PLAN OF CARE: Admit to inpatient   PATIENT DISPOSITION:  PACU - hemodynamically stable.   Delay start of Pharmacological VTE agent (>24hrs) due to surgical blood loss or risk of bleeding: not applicable             Disposition: PACU - hemodynamically stable.         Condition: stable   Hermina StaggersErvin, Algie Westry L, MD 06/07/2017 2:59 PM

## 2017-06-07 NOTE — Anesthesia Preprocedure Evaluation (Signed)
Anesthesia Evaluation  Patient identified by MRN, date of birth, ID band Patient awake    Reviewed: Allergy & Precautions, H&P , NPO status , Patient's Chart, lab work & pertinent test results  Airway Mallampati: I  TM Distance: >3 FB Neck ROM: full    Dental no notable dental hx. (+) Teeth Intact   Pulmonary neg pulmonary ROS,    Pulmonary exam normal breath sounds clear to auscultation       Cardiovascular negative cardio ROS Normal cardiovascular exam Rhythm:Regular Rate:Normal     Neuro/Psych negative neurological ROS  negative psych ROS   GI/Hepatic negative GI ROS, Neg liver ROS,   Endo/Other  negative endocrine ROS  Renal/GU negative Renal ROS     Musculoskeletal   Abdominal Normal abdominal exam  (+)   Peds  Hematology  (+) Blood dyscrasia, anemia ,   Anesthesia Other Findings   Reproductive/Obstetrics (+) Pregnancy                             Anesthesia Physical Anesthesia Plan  ASA: II  Anesthesia Plan: Spinal   Post-op Pain Management:    Induction:   PONV Risk Score and Plan: 3 and Ondansetron, Dexamethasone and Scopolamine patch - Pre-op  Airway Management Planned: Nasal Cannula, Simple Face Mask and Natural Airway  Additional Equipment:   Intra-op Plan:   Post-operative Plan:   Informed Consent: I have reviewed the patients History and Physical, chart, labs and discussed the procedure including the risks, benefits and alternatives for the proposed anesthesia with the patient or authorized representative who has indicated his/her understanding and acceptance.     Plan Discussed with: CRNA and Surgeon  Anesthesia Plan Comments:         Anesthesia Quick Evaluation

## 2017-06-07 NOTE — Transfer of Care (Signed)
Immediate Anesthesia Transfer of Care Note  Patient: Annette Rodriguez  Procedure(s) Performed: CESAREAN SECTION (N/A Abdomen)  Patient Location: PACU  Anesthesia Type:Spinal  Level of Consciousness: awake and alert   Airway & Oxygen Therapy: Patient Spontanous Breathing  Post-op Assessment: Report given to RN and Post -op Vital signs reviewed and stable  Post vital signs: Reviewed  Last Vitals:  Vitals Value Taken Time  BP    Temp    Pulse 66 06/07/2017  2:56 PM  Resp 20 06/07/2017  2:56 PM  SpO2 100 % 06/07/2017  2:56 PM  Vitals shown include unvalidated device data.  Last Pain:  Vitals:   06/07/17 0924  TempSrc:   PainSc: 7       Patients Stated Pain Goal: 0 (06/07/17 0924)  Complications: No apparent anesthesia complications

## 2017-06-07 NOTE — Lactation Note (Signed)
This note was copied from a baby's chart. Lactation Consultation Note  Patient Name: Annette Rodriguez ZOXWR'UToday's Date: 06/07/2017 Reason for consult: Initial assessment;Late-preterm 34-36.6wks;Infant < 6lbs  9 hours old late preterm < 6lbs female who is being exclusively BF by her mother, she's a P2. Mom is experienced BF, she was able to BF her other child for 8 months without any BF complications. Offered assistance with latch but per mom baby fed about 1-2 hours ago and she politely declined. Asked mom to call for latch assistance when needed.  Per mom feedings at the breast are comfortable and both of her nipples are intact with any signs of trauma.  Encouraged mom to feed baby on cues STS at least 8-12 times/24 hours and also, to wake baby up for feedings every 3 hours, mom is aware of baby's low weight and SGA status.  Reviewed how to care for your late preterm baby, BF brochure, BF resources and feeding diary. Parents are aware of LC services and will call PRN.  Maternal Data Formula Feeding for Exclusion: No Has patient been taught Hand Expression?: Yes Does the patient have breastfeeding experience prior to this delivery?: Yes  Feeding Feeding Type: Breast Fed Length of feed: 13 min  Interventions Interventions: Breast feeding basics reviewed  Lactation Tools Discussed/Used WIC Program: No   Consult Status Consult Status: Follow-up Date: 06/08/17 Follow-up type: In-patient    [name] [name] 06/07/2017, 11:14 PM

## 2017-06-07 NOTE — MAU Note (Signed)
Dr Lilli LightHatchet notified of pt in MAU for repeat c/s

## 2017-06-07 NOTE — Anesthesia Postprocedure Evaluation (Signed)
Anesthesia Post Note  Patient: Annette GaussShaniah Rodriguez  Procedure(s) Performed: CESAREAN SECTION (N/A Abdomen)     Patient location during evaluation: PACU Anesthesia Type: Spinal Level of consciousness: awake Pain management: pain level controlled Vital Signs Assessment: post-procedure vital signs reviewed and stable Respiratory status: spontaneous breathing Cardiovascular status: stable Postop Assessment: no headache, no backache, spinal receding, patient able to bend at knees and no apparent nausea or vomiting Anesthetic complications: no    Last Vitals:  Vitals:   06/07/17 1735 06/07/17 1840  BP: (!) 118/57 114/71  Pulse: 68 75  Resp: 16 16  Temp: 37.3 C 37.5 C  SpO2: 100% 100%    Last Pain:  Vitals:   06/07/17 1840  TempSrc: Oral  PainSc:    Pain Goal: Patients Stated Pain Goal: 0 (06/07/17 0924)               Anupama Piehl JR,JOHN Susann GivensFRANKLIN

## 2017-06-07 NOTE — MAU Note (Signed)
Urine in lab 

## 2017-06-08 LAB — CBC
HCT: 28.5 % — ABNORMAL LOW (ref 36.0–46.0)
Hemoglobin: 9.4 g/dL — ABNORMAL LOW (ref 12.0–15.0)
MCH: 28 pg (ref 26.0–34.0)
MCHC: 33 g/dL (ref 30.0–36.0)
MCV: 84.8 fL (ref 78.0–100.0)
PLATELETS: 264 10*3/uL (ref 150–400)
RBC: 3.36 MIL/uL — ABNORMAL LOW (ref 3.87–5.11)
RDW: 13.2 % (ref 11.5–15.5)
WBC: 14.5 10*3/uL — AB (ref 4.0–10.5)

## 2017-06-08 LAB — RPR: RPR: NONREACTIVE

## 2017-06-08 NOTE — Progress Notes (Signed)
Subjective: Postpartum Day 1: Cesarean Delivery Patient reports tolerating PO, + flatus and no problems voiding.    Objective: Vital signs in last 24 hours: Temp:  [98 F (36.7 C)-99.5 F (37.5 C)] 98.4 F (36.9 C) (04/07 0355) Pulse Rate:  [60-95] 79 (04/07 0615) Resp:  [13-23] 18 (04/07 0355) BP: (102-123)/(54-77) 102/54 (04/07 0355) SpO2:  [97 %-100 %] 100 % (04/07 0615) Weight:  [50.3 kg (111 lb)] 50.3 kg (111 lb) (04/06 0914)  Physical Exam:  General: alert and cooperative Lochia: appropriate Uterine Fundus: firm Incision: no significant drainage, no significant erythema. Appropriate amount of blood noted on honeycomb dressing. DVT Evaluation: No evidence of DVT seen on physical exam.  Recent Labs    06/07/17 1122 06/08/17 0535  HGB 10.5* 9.4*  HCT 31.2* 28.5*    Assessment/Plan: Status post Cesarean section. Doing well postoperatively.  Continue current care.  Myrene BuddyJacob Sydnee Lamour 06/08/2017, 8:57 AM

## 2017-06-08 NOTE — Anesthesia Postprocedure Evaluation (Signed)
Anesthesia Post Note  Patient: Annette GaussShaniah Rodriguez  Procedure(s) Performed: CESAREAN SECTION (N/A Abdomen)     Patient location during evaluation: Mother Baby Anesthesia Type: Spinal Level of consciousness: awake and alert Pain management: pain level controlled Vital Signs Assessment: post-procedure vital signs reviewed and stable Respiratory status: spontaneous breathing Cardiovascular status: blood pressure returned to baseline Postop Assessment: no headache, spinal receding, patient able to bend at knees and no apparent nausea or vomiting Anesthetic complications: no Comments: Pain score 3.    Last Vitals:  Vitals:   06/08/17 0355 06/08/17 0615  BP: (!) 102/54   Pulse: 66 79  Resp: 18   Temp: 36.9 C   SpO2: 99% 100%    Last Pain:  Vitals:   06/08/17 0615  TempSrc:   PainSc: 0-No pain   Pain Goal: Patients Stated Pain Goal: 3 (06/07/17 2250)               Merrilyn PumaWRINKLE,Raylin Winer

## 2017-06-08 NOTE — Clinical Social Work Maternal (Signed)
  CLINICAL SOCIAL WORK MATERNAL/CHILD NOTE  Patient Details  Name: Annette Rodriguez MRN: [id number] Date of Birth: 06/07/2018  Date:  06/08/2017  Clinical Social Worker Initiating Note:  Madilyn Fireman, MSW, LCSW-A Date/Time: Initiated:  06/08/17/0934     Child's Name:  Annette Rodriguez   Biological Parents:  Mother, Father   Need for Interpreter:  None   Reason for Referral:  Late or No Prenatal Care    Address:  [address] [place] [address]    Phone number:  [phone] (home)     Additional phone number:   Household Members/Support Persons (HM/SP):   Household Member/Support Person 1   HM/SP Name Relationship DOB or Age  HM/SP -1 Annette Rodriguez Spouse    HM/SP -2        HM/SP -3        HM/SP -4        HM/SP -5        HM/SP -6        HM/SP -7        HM/SP -8          Natural Supports (not living in the home):  Immediate Family, Friends, Extended Family   Professional Supports:     Employment: Unemployed   Type of Work:     Education:  Programmer, systems   Homebound arranged:    Museum/gallery curator Resources:  [employer]   Other Resources:  [employer], Physicist, medical    Cultural/Religious Considerations Which May Impact Care:  None  Strengths:  Ability to meet basic needs , Home prepared for child    Psychotropic Medications:   None      Pediatrician:    [place]  Pediatrician List:   [facility]      Pediatrician Fax Number:    Risk Factors/Current Problems:      Cognitive State:  Alert , Able to Concentrate    Mood/Affect:  Calm , Comfortable    CSW Assessment: CSW met with patient, newborn, father of baby, and older sibling at bedside to discuss consult for late entry to prenatal care. CSW spoke with patient to determine why she was so late into care, patient moved to [place] at 31 weeks with her boyfriend and FOB, Annette Rodriguez. This is the patient's second birth, her oldest child is Scientist, research (medical) (DOB 05/11/15). CSW informed patient that due to [facility] that her and the newborn would have urine drug screens completed along with a cord tissue test for the newborn. Patient stated understanding and stated that she is not concerned about the results. CSW educated patient on hospital drug screening policy. Patient reported that she was undecided at this time on the pediatrician. Patient states she has a used car seat for her newborn, CSW informed patient that newborn could not be discharged with an expired car seat. Patient states that the infant will sleep in a crib at home, SIDS reduction techniques discussed. Patient reports having good supports from both sides of their families. Patient receives [facility] and [employer] and is unemployed.  CSW Plan/Description:  Sudden Infant Death Syndrome (SIDS) Education, Perinatal Mood and Anxiety Disorder (PMADs) Education, Neonatal Abstinence Syndrome (NAS) Education, [name], [name] 06/08/2017, 9:36 AM

## 2017-06-08 NOTE — Progress Notes (Signed)
CSW completed CPS report to notify of substance exposed infant.  At this time, there are no barriers to discharge.  Carson Bogden, MSW, LCSW-A Clinical Social Worker [facility] [phone]  

## 2017-06-08 NOTE — Addendum Note (Signed)
Addendum  created 06/08/17 1028 by Angela AdamWrinkle, Loxley Schmale G, CRNA   Sign clinical note

## 2017-06-09 MED ORDER — DOCUSATE SODIUM 100 MG PO CAPS: 100.0000 mg | ORAL_CAPSULE | Freq: Every day | ORAL | 1 refills | Status: DC

## 2017-06-09 MED ORDER — ONDANSETRON HCL 4 MG PO TABS
4.0000 mg | ORAL_TABLET | Freq: Three times a day (TID) | ORAL | Status: DC | PRN
  Administered 2017-06-09: 4 mg via ORAL
  Filled 2017-06-09: qty 1

## 2017-06-09 MED ORDER — OXYCODONE-ACETAMINOPHEN 5-325 MG PO TABS: 1.0000 | ORAL_TABLET | ORAL | 0 refills | Status: DC | PRN

## 2017-06-09 MED ORDER — IBUPROFEN 800 MG PO TABS: 800.0000 mg | ORAL_TABLET | Freq: Three times a day (TID) | ORAL | 0 refills | Status: DC

## 2017-06-09 NOTE — Progress Notes (Deleted)
Post Partum Day 2 Subjective: up ad lib, voiding and tolerating PO Had small amt flatus and loose stool this am Still c/o abdominal pain and distension  Objective: Blood pressure 110/70, pulse 68, temperature 98.5 F (36.9 C), temperature source Oral, resp. rate 18, height 4\' 11"  (1.499 m), weight 111 lb (50.3 kg), last menstrual period 09/17/2016, SpO2 100 %.  Physical Exam:  General: alert, cooperative and no distress Abd: firm, distended, tender, BS only in RUQ Lochia: appropriate Uterine Fundus: firm Incision: healing well, no significant drainage, no dehiscence, no significant erythema; honeyccomg c/d/i DVT Evaluation: No evidence of DVT seen on physical exam. Negative Homan's sign. No cords or calf tenderness. No significant calf/ankle edema.  Recent Labs    06/07/17 1122 06/08/17 0535  HGB 10.5* 9.4*  HCT 31.2* 28.5*    Assessment/Plan: Plan for discharge tomorrow, Breastfeeding and Lactation consult Ilues-confirmed on KUB SS enema this am Ambulate Warm liquids   LOS: 2 days   Annette Rodriguez, CNM 06/09/2017, 7:46 AM

## 2017-06-09 NOTE — Discharge Summary (Signed)
OB Discharge Summary     Patient Name: Annette Rodriguez DOB: 14-Aug-1995 MRN: [id number]  Date of admission: 06/07/2017 Delivering MD: Hermina Staggers   Date of discharge: 06/09/2017  Admitting diagnosis: 37 wks ctx, previous Cesarean Section Intrauterine pregnancy: [redacted]w[redacted]d     Secondary diagnosis:  Active Problems:   Post-operative state  Additional problems: limited PNC, Anemia, Cystic fibrosis carrier      Discharge diagnosis: Term Pregnancy Delivered                                                                                                Post partum procedures:n/a  Augmentation: n/a  Complications: None  Hospital course:  Onset of Labor With Unplanned C/S  22 y.o. yo G2P0101 at [redacted]w[redacted]d was admitted in Active Labor on 06/07/2017. Membrane Rupture Time/Date: 2:13 PM ,06/07/2017   The patient went for cesarean section due to Elective Repeat, and delivered a Viable infant,06/07/2017  Details of operation can be found in separate operative note. Patient had an uncomplicated postpartum course.  She is ambulating,23 tolerating a regular diet, passing flatus, and urinating well.  Patient is discharged home in stable condition 06/10/17.  Physical exam  Vitals:   06/08/17 1051 06/08/17 1357 06/08/17 1719 06/09/17 0642  BP:  109/63 124/77 110/70  Pulse: 76 68 86 68  Resp:  Temp:  99.1 F (37.3 C) 97.9 F (36.6 C) 98.5 F (36.9 C)  TempSrc:  Oral  Oral  SpO2: 99% 100%  100%  Weight:      Height:       General: alert, cooperative and no distress Lochia: appropriate Uterine Fundus: firm Incision: Healing well with no significant drainage, No significant erythema, Dressing is clean, dry, and intact DVT Evaluation: No evidence of DVT seen on physical exam. Negative Homan's sign. No cords or calf tenderness. No significant calf/ankle edema. Labs: Lab Results  Component Value Date   WBC 14.5 (H) 06/08/2017   HGB 9.4 (L) 06/08/2017   HCT 28.5 (L) 06/08/2017   MCV 84.8  06/08/2017   PLT 264 06/08/2017   No flowsheet data found.  Discharge instruction: per After Visit Summary and "Baby and Me Booklet".  After visit meds:  Allergies as of 06/09/2017   No Known Allergies     Medication List    STOP taking these medications   Doxylamine-Pyridoxine 10-10 MG Tbec Commonly known as:  DICLEGIS   omeprazole 20 MG capsule Commonly known as:  PRILOSEC   ondansetron 8 MG tablet Commonly known as:  ZOFRAN     TAKE these medications   CITRANATAL BLOOM 90-1 MG Tabs Take 1 tablet by mouth daily.   docusate sodium 100 MG capsule Commonly known as:  COLACE Take 1 capsule (100 mg total) by mouth daily.   ibuprofen 800 MG tablet Commonly known as:  ADVIL,MOTRIN Take 1 tablet (800 mg total) by mouth 3 (three) times daily.   oxyCODONE-acetaminophen 5-325 MG tablet Commonly known as:  PERCOCET/ROXICET Take 1 tablet by mouth every 4 (four) hours as needed (pain scale 4-7).   VITAFOL-NANO 18-0.6-0.4 MG Tabs Take 1  tablet by mouth daily.   Vitamin D (Ergocalciferol) 50000 units Caps capsule Commonly known as:  DRISDOL Take 1 capsule (50,000 Units total) by mouth every 7 (seven) days.       Diet: routine diet  Activity: Advance as tolerated. Pelvic rest for 6 weeks.   Outpatient follow up:6 weeks Follow up Appt: Future Appointments  Date Time Provider Department Center  06/11/2017  9:15 AM [facility] [place] 4 [facility]-MFCUS [facility]-US  06/11/2017 11:00 AM WH-M[facility] GENETIC COUNSELING RM [facility]  06/11/2017  1:15 PM Hermina Staggers, MD [facility] None  06/17/2017  9:45 AM WH-SDCW PAT 5 WH-SD[facility] None   Follow up Visit:No follow-ups on file.  Postpartum contraception: Depo Provera  Newborn Data: Live born female  Birth Weight: 4 lb 15.9 oz (2265 g) APGAR: 8, 9  Newborn Delivery   Birth date/time:  06/07/2017 14:13:00 Delivery type:  C-Section, Low Transverse Trial of labor:  No C-section categorization:  Repeat     Baby Feeding: Breast Disposition:home  with mother   06/09/2017 Donette Larry, CNM

## 2017-06-10 ENCOUNTER — Encounter (HOSPITAL_COMMUNITY): Payer: Self-pay | Admitting: *Deleted

## 2017-06-10 ENCOUNTER — Other Ambulatory Visit: Payer: Self-pay

## 2017-06-10 NOTE — Discharge Instructions (Signed)

## 2017-06-10 NOTE — Discharge Summary (Signed)
OB Discharge Summary     Patient Name: Annette GaussShaniah Selmer DOB: 05/03/1995 MRN: [id number]  Date of admission: 06/07/2017 Delivering MD: Hermina StaggersERVIN, MICHAEL L   Date of discharge: 06/10/2017  Admitting diagnosis: 37 wks ctx Intrauterine pregnancy: 6472w0d     Secondary diagnosis:  Active Problems:   Post-operative state  Additional problems: insufficient prenatal care, h/o c-section for abruption, anemia, CF carrier     Discharge diagnosis: Term Pregnancy Delivered                                                                                                Post partum procedures:None  Augmentation: None  Complications: None  Hospital course:  Onset of Labor With Unplanned C/S  22 y.o. yo G2P0101 at 3861w4d was admitted in Latent Labor on 06/07/2017. Membrane Rupture Time/Date: 2:13 PM ,06/07/2017   The patient went for cesarean section due to Elective Repeat, and delivered a Viable infant,06/07/2017  Details of operation can be found in separate operative note. Patient had an uncomplicated postpartum course.  She is ambulating,tolerating a regular diet, passing flatus, and urinating well.  Patient is discharged home in stable condition 06/10/17.  Physical exam  Vitals:   06/08/17 1719 06/09/17 0642 06/09/17 1722 06/10/17 0606  BP: 124/77 110/70 109/72 (!) 116/95  Pulse: 86 68 (!) 59 80  Resp: 20 18 16 18   Temp: 97.9 F (36.6 C) 98.5 F (36.9 C) 98.1 F (36.7 C) 98.2 F (36.8 C)  TempSrc:  Oral Oral Oral  SpO2:  100% 99% 99%  Weight:      Height:       General: alert, cooperative and no distress Lochia: appropriate Uterine Fundus: firm Incision: Dressing is clean, dry, and intact DVT Evaluation: No evidence of DVT seen on physical exam. Labs: Lab Results  Component Value Date   WBC 14.5 (H) 06/08/2017   HGB 9.4 (L) 06/08/2017   HCT 28.5 (L) 06/08/2017   MCV 84.8 06/08/2017   PLT 264 06/08/2017   No flowsheet data found.  Discharge instruction: per After Visit Summary and  "Baby and Me Booklet".  After visit meds:  Allergies as of 06/10/2017   No Known Allergies     Medication List    STOP taking these medications   Doxylamine-Pyridoxine 10-10 MG Tbec Commonly known as:  DICLEGIS   omeprazole 20 MG capsule Commonly known as:  PRILOSEC   ondansetron 8 MG tablet Commonly known as:  ZOFRAN     TAKE these medications   CITRANATAL BLOOM 90-1 MG Tabs Take 1 tablet by mouth daily.   docusate sodium 100 MG capsule Commonly known as:  COLACE Take 1 capsule (100 mg total) by mouth daily.   ibuprofen 800 MG tablet Commonly known as:  ADVIL,MOTRIN Take 1 tablet (800 mg total) by mouth 3 (three) times daily.   oxyCODONE-acetaminophen 5-325 MG tablet Commonly known as:  PERCOCET/ROXICET Take 1 tablet by mouth every 4 (four) hours as needed (pain scale 4-7).   VITAFOL-NANO 18-0.6-0.4 MG Tabs Take 1 tablet by mouth daily.   Vitamin D (Ergocalciferol) 50000 units Caps capsule Commonly known as:  DRISDOL Take 1 capsule (50,000 Units total) by mouth every 7 (seven) days.       Diet: routine diet  Activity: Advance as tolerated. Pelvic rest for 6 weeks.   Outpatient follow up:4 weeks Follow up Appt: Future Appointments  Date Time Provider Department Center  07/07/2017  1:30 PM Constant, Gigi Gin, MD [facility] None   Follow up Visit:No follow-ups on file.  Postpartum contraception: Depo Provera  Newborn Data: Live born female  Birth Weight: 4 lb 15.9 oz (2265 g) APGAR: 8, 9  Newborn Delivery   Birth date/time:  06/07/2017 14:13:00 Delivery type:  C-Section, Low Transverse Trial of labor:  No C-section categorization:  Repeat     Baby Feeding: Breast Disposition:home with mother   06/10/2017 Caryl Ada, DO

## 2017-06-10 NOTE — Lactation Note (Signed)
This note was copied from a baby's chart. Lactation Consultation Note Baby 9361 hrs old. RN reported mom wasn't feeding baby in timely manner nor supplementing as she should. Baby has had wtl loss and wt. 4.11lbs.  Mom pumping w/DEBP when LC entered rm. Discussed engorgement management, Ice, massage, and pumping. LPI reviewed again w/mom. Stressed supplementing amount according to hours of age. Mom is pump a lot of transitional milk, encouraged to use BM, discussed milk storage.  Reported to RN if discussion.   Patient Name: Annette Jena GaussShaniah Rodriguez ZOXWR'UToday's Date: 06/10/2017 Reason for consult: Follow-up assessment;Early term 37-38.6wks;Infant < 6lbs;Engorgement   Maternal Data    Feeding    LATCH Score       Type of Nipple: Everted at rest and after stimulation  Comfort (Breast/Nipple): Engorged, cracked, bleeding, large blisters, severe discomfort        Interventions Interventions: Breast massage;DEBP;Breast compression;Ice  Lactation Tools Discussed/Used Pump Review: Setup, frequency, and cleaning;Milk Storage Initiated by:: RN   Consult Status Consult Status: Follow-up Date: 06/10/17 Follow-up type: In-patient    Charyl DancerCARVER, Ople Girgis G 06/10/2017, 3:32 AM

## 2017-06-11 ENCOUNTER — Encounter (HOSPITAL_COMMUNITY): Payer: Medicaid Other

## 2017-06-11 ENCOUNTER — Encounter: Payer: Medicaid Other | Admitting: Obstetrics and Gynecology

## 2017-06-11 ENCOUNTER — Ambulatory Visit (HOSPITAL_COMMUNITY): Payer: Medicaid Other

## 2017-06-17 ENCOUNTER — Encounter (HOSPITAL_COMMUNITY)
Admission: RE | Admit: 2017-06-17 | Discharge: 2017-06-17 | Disposition: A | Discharge status: Home / Self Care | Payer: Medicaid Other | Source: Ambulatory Visit | Attending: Pediatrics | Admitting: Pediatrics

## 2017-06-18 ENCOUNTER — Inpatient Hospital Stay (HOSPITAL_COMMUNITY)
Admission: RE | Admit: 2017-06-18 | Payer: Medicaid Other | Source: Ambulatory Visit | Admitting: Obstetrics and Gynecology

## 2017-07-07 ENCOUNTER — Ambulatory Visit (INDEPENDENT_AMBULATORY_CARE_PROVIDER_SITE_OTHER): Payer: Medicaid Other | Admitting: Obstetrics and Gynecology

## 2017-07-07 ENCOUNTER — Encounter: Payer: Self-pay | Admitting: Obstetrics and Gynecology

## 2017-07-07 DIAGNOSIS — Z3202 Encounter for pregnancy test, result negative: Secondary | ICD-10-CM | POA: Diagnosis not present

## 2017-07-07 DIAGNOSIS — O0990 Supervision of high risk pregnancy, unspecified, unspecified trimester: Secondary | ICD-10-CM

## 2017-07-07 DIAGNOSIS — Z3042 Encounter for surveillance of injectable contraceptive: Secondary | ICD-10-CM

## 2017-07-07 DIAGNOSIS — Z1389 Encounter for screening for other disorder: Secondary | ICD-10-CM

## 2017-07-07 LAB — POCT URINE PREGNANCY: Preg Test, Ur: NEGATIVE

## 2017-07-07 MED ORDER — MEDROXYPROGESTERONE ACETATE 150 MG/ML IM SUSP
150.0000 mg | Freq: Once | INTRAMUSCULAR | Status: AC
  Administered 2017-07-07: 150 mg via INTRAMUSCULAR

## 2017-07-07 NOTE — Progress Notes (Signed)
Subjective:     Annette Rodriguez is a 22 y.o. female who presents for a postpartum visit. She is 4 weeks postpartum following a low cervical transverse Cesarean section. I have fully reviewed the prenatal and intrapartum course. The delivery was at 37 gestational weeks due to labor and desire for elective repeat c-section. Outcome: repeat cesarean section, low transverse incision. Anesthesia: spinal. Postpartum course has been uncomplicated. Baby's course has been unremarkable. Baby is feeding by breast. Bleeding no bleeding. Bowel function is normal. Bladder function is normal. Patient is not sexually active. Contraception method is abstinence. Postpartum depression screening: negative.    Review of Systems Pertinent items are noted in HPI.   Objective:    BP (!) 106/59   Pulse 98   Temp 99.1 F (37.3 C)   Wt 97 lb 14.4 oz (44.4 kg)   BMI 19.77 kg/m   General:  alert, cooperative and no distress   Breasts:  inspection negative, no nipple discharge or bleeding, no masses or nodularity palpable  Lungs: clear to auscultation bilaterally  Heart:  regular rate and rhythm  Abdomen: soft, non-tender; bowel sounds normal; no masses,  no organomegaly and incision: no erythema, induration or drainage   Vulva:  normal  Vagina: normal vagina, no discharge, exudate, lesion, or erythema  Cervix:  nulliparous appearance  Corpus: normal size, contour, position, consistency, mobility, non-tender  Adnexa:  no mass, fullness, tenderness  Rectal Exam: Not performed.        Assessment:     Normal postpartum exam. Pap smear not done at today's visit (normal 04/2017).   Plan:    1. Contraception: Depo-Provera injections 2. Patient is medically cleared to resume all activities of daily living.  3. Follow up in: 9 months for annual exam or as needed.

## 2017-09-29 ENCOUNTER — Ambulatory Visit (INDEPENDENT_AMBULATORY_CARE_PROVIDER_SITE_OTHER): Payer: Medicaid Other

## 2017-09-29 VITALS — BP 110/68 | HR 97 | Wt 105.6 lb

## 2017-09-29 DIAGNOSIS — Z3042 Encounter for surveillance of injectable contraceptive: Secondary | ICD-10-CM | POA: Diagnosis not present

## 2017-09-29 MED ORDER — MEDROXYPROGESTERONE ACETATE 150 MG/ML IM SUSP
150.0000 mg | Freq: Once | INTRAMUSCULAR | Status: AC
  Administered 2017-09-29: 150 mg via INTRAMUSCULAR

## 2017-09-29 MED ORDER — MEDROXYPROGESTERONE ACETATE 150 MG/ML IM SUSP: 150.0000 mg | INTRAMUSCULAR | 4 refills | Status: DC

## 2017-09-29 NOTE — Progress Notes (Signed)
Pt is here for second depo injection. Pt is within window. Next depo due 9/24-9/28

## 2017-12-22 ENCOUNTER — Ambulatory Visit (INDEPENDENT_AMBULATORY_CARE_PROVIDER_SITE_OTHER): Payer: Medicaid Other | Admitting: *Deleted

## 2017-12-22 DIAGNOSIS — Z3042 Encounter for surveillance of injectable contraceptive: Secondary | ICD-10-CM

## 2017-12-22 MED ORDER — MEDROXYPROGESTERONE ACETATE 150 MG/ML IM SUSP: 150.0000 mg | INTRAMUSCULAR | 2 refills | Status: DC

## 2017-12-22 MED ORDER — MEDROXYPROGESTERONE ACETATE 150 MG/ML IM SUSP
150.0000 mg | INTRAMUSCULAR | Status: DC
  Administered 2017-12-22: 150 mg via INTRAMUSCULAR

## 2017-12-22 NOTE — Progress Notes (Signed)
Pt is in office for depo injection.  Pt is on time for injection.   Pt was given Depo from office stock today due to not knowing that Rx was sent.  Rx was reordered with 2 refills.  Pt advised to pick up Rx and bring to next visit.  Pt states understanding and has no other concerns.   BP 113/75   Pulse 84   Wt 110 lb (49.9 kg)   BMI 22.22 kg/m   Administrations This Visit    medroxyPROGESTERone (DEPO-PROVERA) injection 150 mg    Admin Date 12/22/2017 Action Given Dose 150 mg Route Intramuscular Administered By Lanney Gins, CMA

## 2017-12-23 NOTE — Progress Notes (Signed)
I have reviewed the chart and agree with nursing staff's documentation of this patient's encounter.  Dana Dorner, MD 12/23/2017 3:46 PM    

## 2018-03-10 ENCOUNTER — Ambulatory Visit: Payer: Medicaid Other

## 2018-03-12 ENCOUNTER — Telehealth: Payer: Self-pay | Admitting: Obstetrics and Gynecology

## 2018-03-19 ENCOUNTER — Ambulatory Visit (INDEPENDENT_AMBULATORY_CARE_PROVIDER_SITE_OTHER): Payer: Medicaid Other

## 2018-03-19 VITALS — BP 113/72 | HR 85 | Wt 113.0 lb

## 2018-03-19 DIAGNOSIS — Z3042 Encounter for surveillance of injectable contraceptive: Secondary | ICD-10-CM

## 2018-03-19 MED ORDER — MEDROXYPROGESTERONE ACETATE 150 MG/ML IM SUSP
150.0000 mg | Freq: Once | INTRAMUSCULAR | Status: AC
  Administered 2018-03-19: 150 mg via INTRAMUSCULAR

## 2018-03-19 NOTE — Progress Notes (Signed)
Presents for DEPO.  Office Stock given in [place], tolerated well.  Next DEPO 04/03-17/2020.  Administrations This Visit    medroxyPROGESTERone (DEPO-PROVERA) injection 150 mg    Admin Date 03/19/2018 Action Given Dose 150 mg Route Intramuscular Administered By Maretta Bees, RMA

## 2018-06-09 ENCOUNTER — Ambulatory Visit: Payer: Medicaid Other

## 2018-06-09 ENCOUNTER — Ambulatory Visit: Payer: Medicaid Other | Admitting: Obstetrics

## 2018-08-21 ENCOUNTER — Ambulatory Visit: Payer: Medicaid Other | Admitting: Obstetrics

## 2018-12-19 ENCOUNTER — Other Ambulatory Visit: Payer: Self-pay

## 2018-12-19 ENCOUNTER — Encounter (HOSPITAL_COMMUNITY): Payer: Self-pay

## 2018-12-19 ENCOUNTER — Ambulatory Visit (HOSPITAL_COMMUNITY)
Admission: EM | Admit: 2018-12-19 | Discharge: 2018-12-19 | Disposition: A | Discharge status: Home / Self Care | Payer: Medicaid Other | Attending: Family Medicine | Admitting: Family Medicine

## 2018-12-19 ENCOUNTER — Emergency Department (HOSPITAL_COMMUNITY)
Admission: EM | Admit: 2018-12-19 | Discharge: 2018-12-19 | Disposition: A | Discharge status: Home / Self Care | Payer: Medicaid Other | Attending: Emergency Medicine | Admitting: Emergency Medicine

## 2018-12-19 DIAGNOSIS — Z5321 Procedure and treatment not carried out due to patient leaving prior to being seen by health care provider: Secondary | ICD-10-CM | POA: Insufficient documentation

## 2018-12-19 DIAGNOSIS — N898 Other specified noninflammatory disorders of vagina: Secondary | ICD-10-CM | POA: Insufficient documentation

## 2018-12-19 DIAGNOSIS — R102 Pelvic and perineal pain: Secondary | ICD-10-CM | POA: Insufficient documentation

## 2018-12-19 DIAGNOSIS — Z3202 Encounter for pregnancy test, result negative: Secondary | ICD-10-CM

## 2018-12-19 LAB — URINALYSIS, ROUTINE W REFLEX MICROSCOPIC
Bilirubin Urine: NEGATIVE
Glucose, UA: NEGATIVE mg/dL
Hgb urine dipstick: NEGATIVE
Ketones, ur: NEGATIVE mg/dL
Leukocytes,Ua: NEGATIVE
Nitrite: NEGATIVE
Protein, ur: NEGATIVE mg/dL
Specific Gravity, Urine: 1.025 (ref 1.005–1.030)
pH: 6 (ref 5.0–8.0)

## 2018-12-19 LAB — POCT URINALYSIS DIP (DEVICE)
Bilirubin Urine: NEGATIVE
Glucose, UA: NEGATIVE mg/dL
Leukocytes,Ua: NEGATIVE
Nitrite: POSITIVE — AB
Protein, ur: NEGATIVE mg/dL
Specific Gravity, Urine: 1.025 (ref 1.005–1.030)
Urobilinogen, UA: 0.2 mg/dL (ref 0.0–1.0)
pH: 5 (ref 5.0–8.0)

## 2018-12-19 LAB — POCT PREGNANCY, URINE: Preg Test, Ur: NEGATIVE

## 2018-12-19 LAB — POC URINE PREG, ED: Preg Test, Ur: NEGATIVE

## 2018-12-19 MED ORDER — METRONIDAZOLE 0.75 % VA GEL: 1.0000 | Freq: Every day | VAGINAL | 0 refills | Status: AC

## 2018-12-19 NOTE — ED Triage Notes (Signed)
Pt c/o "hot" feeling to vagina. Recently treated for UTI with Macrobid. States her urine feels "hotter than usual".

## 2018-12-19 NOTE — ED Triage Notes (Signed)
Pt states she has a vaginal discharge x 4 days it 's watery.

## 2018-12-19 NOTE — ED Notes (Signed)
Called for times 2 , no answer

## 2018-12-19 NOTE — Discharge Instructions (Signed)
Please begin metrogel- applicatorful at bedtime  Urine culture and swab pending  Please continue to monitor symptoms

## 2018-12-20 LAB — URINE CULTURE: Culture: <10000 — AB

## 2018-12-20 NOTE — ED Provider Notes (Signed)
[facility]    CSN: [id number] Arrival date & time: 12/19/18  1639      History   Chief Complaint Chief Complaint  Patient presents with  . Vaginal Discharge    HPI Annette Rodriguez is a 23 y.o. female no significant past medical history presenting today for evaluation of dysuria and vaginal discharge.  Patient states that over the past 1 to 2 weeks she has had dysuria, urinary frequency as well as vaginal discharge.  She has a lot of irritation with urination, but she is unsure if this is related to a UTI or possible vaginal cause.  Patient did an ED visit earlier this week and was empirically prescribed Macrobid.  She finished this yesterday.  Her symptoms slightly improved, but never fully resolved.  After stopping the medicine her symptoms have worsened again.  She denies any rashes or lesions, but has noticed some swelling around her clitoris.  She is also noticed a discharge over the past 4 days which she describes as being very thin.  She denies any new partners or any concerns for STDs.  She denies pelvic pain, abdominal pain.  Denies fevers nausea or vomiting.  Last menstrual cycle was approximately 2 weeks ago.  Denies any form of birth control.  HPI  Past Medical History:  Diagnosis Date  . Anemia   . Cystic fibrosis carrier     Patient Active Problem List   Diagnosis Date Noted  . Post-operative state 06/07/2017  . Cystic fibrosis carrier 05/07/2017  . Anemia affecting pregnancy in third trimester 04/29/2017  . Vitamin D deficiency 04/25/2017  . Supervision of high risk pregnancy, antepartum 04/23/2017  . History of placenta abruption 04/23/2017  . Insufficient prenatal care 04/23/2017  . History of C-section 04/23/2017    Past Surgical History:  Procedure Laterality Date  . CESAREAN SECTION  05/11/2015  . CESAREAN SECTION N/A 06/07/2017   Procedure: CESAREAN SECTION;  Surgeon: Hermina StaggersErvin, Michael L, MD;  [facility];  Service: Obstetrics;   Laterality: N/A;    OB History    Gravida  2   Para  2   Term  1   Preterm  1   AB      Living  1     SAB      TAB      Ectopic      Multiple      Live Births  1            Home Medications    Prior to Admission medications   Medication Sig Start Date End Date Taking? Authorizing Provider  ibuprofen (ADVIL,MOTRIN) 800 MG tablet Take 1 tablet (800 mg total) by mouth 3 (three) times daily. 06/09/17   Donette LarryBhambri, Melanie, CNM  medroxyPROGESTERone (DEPO-PROVERA) 150 MG/ML injection Inject 1 mL (150 mg total) into the muscle every 3 (three) months. 12/22/17   Constant, Peggy, MD  metroNIDAZOLE (METROGEL VAGINAL) 0.75 % vaginal gel Place 1 Applicatorful vaginally at bedtime for 5 days. 12/19/18 12/24/18  Renesmay Nesbitt, Junius CreamerHallie C, PA-C    Family History No family history on file.  Social History Social History   Tobacco Use  . Smoking status: Never Smoker  . Smokeless tobacco: Never Used  Substance Use Topics  . Alcohol use: No    Frequency: Never  . Drug use: No    Types: Marijuana    Comment: Stopped +UPT      Allergies   Patient has no known allergies.   Review of Systems  Review of Systems  Constitutional: Negative for fever.  Respiratory: Negative for shortness of breath.   Cardiovascular: Negative for chest pain.  Gastrointestinal: Negative for abdominal pain, diarrhea, nausea and vomiting.  Genitourinary: Positive for dysuria, frequency and vaginal discharge. Negative for flank pain, genital sores, hematuria, menstrual problem, vaginal bleeding and vaginal pain.  Musculoskeletal: Negative for back pain.  Skin: Negative for rash.  Neurological: Negative for dizziness, light-headedness and headaches.     Physical Exam Triage Vital Signs ED Triage Vitals  Enc Vitals Group     BP 12/19/18 1721 101/70     Pulse Rate 12/19/18 1721 79     Resp 12/19/18 1721 16     Temp 12/19/18 1721 98.2 F (36.8 C)     Temp Source 12/19/18 1721 Oral     SpO2  12/19/18 1721 100 %     Weight 12/19/18 1719 120 lb (54.4 kg)     Height --      Head Circumference --      Peak Flow --      Pain Score 12/19/18 1719 0     Pain Loc --      Pain Edu? --      Excl. in [place]? --    No data found.  Updated Vital Signs BP 101/70 (BP Location: Right Arm)   Pulse 79   Temp 98.2 F (36.8 C) (Oral)   Resp 16   Wt 120 lb (54.4 kg)   LMP 12/12/2018 (Approximate)   SpO2 100%   BMI 24.24 kg/m   Visual Acuity Right Eye Distance:   Left Eye Distance:   Bilateral Distance:    Right Eye Near:   Left Eye Near:    Bilateral Near:     Physical Exam Vitals signs and nursing note reviewed.  Constitutional:      Appearance: She is well-developed.     Comments: No acute distress  HENT:     Head: Normocephalic and atraumatic.     Nose: Nose normal.  Eyes:     Conjunctiva/sclera: Conjunctivae normal.  Neck:     Musculoskeletal: Neck supple.  Cardiovascular:     Rate and Rhythm: Normal rate.  Pulmonary:     Effort: Pulmonary effort is normal. No respiratory distress.  Abdominal:     General: There is no distension.     Comments: Soft, nondistended, nontender to light and deep palpation throughout abdomen  Genitourinary:    Comments: Normal external female genitalia, very mild swelling noted to the clitoral hood, no rashes or lesions noted externally, vaginal mucosa pink, cervix pink without erythema, mild amount of white discharge present Musculoskeletal: Normal range of motion.  Skin:    General: Skin is warm and dry.  Neurological:     Mental Status: She is alert and oriented to person, place, and time.      UC Treatments / Results  Labs (all labs ordered are listed, but only abnormal results are displayed) Labs Reviewed  POCT URINALYSIS DIP (DEVICE) - Abnormal; Notable for the following components:      Result Value   Ketones, ur TRACE (*)    Hgb urine dipstick TRACE (*)    Nitrite POSITIVE (*)    All other components within normal  limits  URINE CULTURE  POC URINE PREG, ED  POCT PREGNANCY, URINE  CERVICOVAGINAL ANCILLARY ONLY    EKG   Radiology No results found.  Procedures Procedures (including critical care time)  Medications Ordered in UC Medications - No data  to display  Initial Impression / Assessment and Plan / UC Course  I have reviewed the triage vital signs and the nursing notes.  Pertinent labs & imaging results that were available during my care of the patient were reviewed by me and considered in my medical decision making (see chart for details).     Patient recently completed course of Macrobid without resolution of symptoms, will go ahead and empirically treat for BV today with metronidazole twice daily x1 week.  We will send vaginal swab off to check for other causes of discharge as well as sending urine culture as well to ensure patient does not have a resistant UTI strain as she did have positive nitrites.  Discussed strict return precautions. Patient verbalized understanding and is agreeable with plan.  Final Clinical Impressions(s) / UC Diagnoses   Final diagnoses:  Vaginal discharge     Discharge Instructions     Please begin metrogel- applicatorful at bedtime  Urine culture and swab pending  Please continue to monitor symptoms   ED Prescriptions    Medication Sig Dispense Auth. Provider   metroNIDAZOLE (METROGEL VAGINAL) 0.75 % vaginal gel Place 1 Applicatorful vaginally at bedtime for 5 days. 50 g Jamariya Davidoff, [place] C, PA-C     PDMP not reviewed this encounter.   Kamarius Buckbee, [place] C, PA-C 12/20/18 1016

## 2018-12-21 LAB — URINE CULTURE: Culture: <10000 — AB

## 2018-12-22 LAB — CERVICOVAGINAL ANCILLARY ONLY
Bacterial vaginitis: POSITIVE — AB
Candida vaginitis: NEGATIVE
Chlamydia: NEGATIVE
Neisseria Gonorrhea: NEGATIVE
Trichomonas: NEGATIVE

## 2019-04-01 ENCOUNTER — Emergency Department (HOSPITAL_COMMUNITY)
Admission: EM | Admit: 2019-04-01 | Discharge: 2019-04-01 | Disposition: A | Discharge status: Home / Self Care | Payer: Medicaid Other | Attending: Emergency Medicine | Admitting: Emergency Medicine

## 2019-04-01 ENCOUNTER — Encounter (HOSPITAL_COMMUNITY): Payer: Self-pay | Admitting: Emergency Medicine

## 2019-04-01 ENCOUNTER — Other Ambulatory Visit: Payer: Self-pay

## 2019-04-01 DIAGNOSIS — R1013 Epigastric pain: Secondary | ICD-10-CM | POA: Diagnosis not present

## 2019-04-01 DIAGNOSIS — Z79899 Other long term (current) drug therapy: Secondary | ICD-10-CM | POA: Diagnosis not present

## 2019-04-01 DIAGNOSIS — O210 Mild hyperemesis gravidarum: Secondary | ICD-10-CM

## 2019-04-01 DIAGNOSIS — O26899 Other specified pregnancy related conditions, unspecified trimester: Secondary | ICD-10-CM | POA: Diagnosis not present

## 2019-04-01 DIAGNOSIS — O219 Vomiting of pregnancy, unspecified: Secondary | ICD-10-CM | POA: Diagnosis present

## 2019-04-01 DIAGNOSIS — Z3A00 Weeks of gestation of pregnancy not specified: Secondary | ICD-10-CM | POA: Insufficient documentation

## 2019-04-01 LAB — URINALYSIS, ROUTINE W REFLEX MICROSCOPIC
Bilirubin Urine: NEGATIVE
Glucose, UA: NEGATIVE mg/dL
Hgb urine dipstick: NEGATIVE
Ketones, ur: 80 mg/dL — AB
Leukocytes,Ua: NEGATIVE
Nitrite: NEGATIVE
Protein, ur: 100 mg/dL — AB
Specific Gravity, Urine: 1.033 — ABNORMAL HIGH (ref 1.005–1.030)
pH: 6 (ref 5.0–8.0)

## 2019-04-01 LAB — COMPREHENSIVE METABOLIC PANEL
ALT: 18 U/L (ref 0–44)
AST: 20 U/L (ref 15–41)
Albumin: 4.4 g/dL (ref 3.5–5.0)
Alkaline Phosphatase: 49 U/L (ref 38–126)
Anion gap: 12 (ref 5–15)
BUN: 12 mg/dL (ref 6–20)
CO2: 21 mmol/L — ABNORMAL LOW (ref 22–32)
Calcium: 9.7 mg/dL (ref 8.9–10.3)
Chloride: 104 mmol/L (ref 98–111)
Creatinine, Ser: 0.71 mg/dL (ref 0.44–1.00)
GFR calc Af Amer: >60 mL/min (ref >60)
GFR calc non Af Amer: >60 mL/min (ref >60)
Glucose, Bld: 95 mg/dL (ref 70–99)
Potassium: 3.4 mmol/L — ABNORMAL LOW (ref 3.5–5.1)
Sodium: 137 mmol/L (ref 135–145)
Total Bilirubin: 0.9 mg/dL (ref 0.3–1.2)
Total Protein: 7.6 g/dL (ref 6.5–8.1)

## 2019-04-01 LAB — LIPASE, BLOOD: Lipase: 18 U/L (ref 11–51)

## 2019-04-01 LAB — CBC
HCT: 35.5 % — ABNORMAL LOW (ref 36.0–46.0)
Hemoglobin: 12 g/dL (ref 12.0–15.0)
MCH: 29.2 pg (ref 26.0–34.0)
MCHC: 33.8 g/dL (ref 30.0–36.0)
MCV: 86.4 fL (ref 80.0–100.0)
Platelets: 274 10*3/uL (ref 150–400)
RBC: 4.11 MIL/uL (ref 3.87–5.11)
RDW: 12.2 % (ref 11.5–15.5)
WBC: 12.9 10*3/uL — ABNORMAL HIGH (ref 4.0–10.5)
nRBC: 0 % (ref 0.0–0.2)

## 2019-04-01 LAB — I-STAT BETA HCG BLOOD, ED (MC, WL, AP ONLY): I-stat hCG, quantitative: >2000 m[IU]/mL — ABNORMAL HIGH (ref <5)

## 2019-04-01 MED ORDER — ALUM & MAG HYDROXIDE-SIMETH 200-200-20 MG/5ML PO SUSP
30.0000 mL | Freq: Once | ORAL | Status: AC
  Administered 2019-04-01: 22:00:00 30 mL via ORAL
  Filled 2019-04-01: qty 30

## 2019-04-01 MED ORDER — ONDANSETRON 4 MG PO TBDP: 4.0000 mg | ORAL_TABLET | Freq: Three times a day (TID) | ORAL | 0 refills | Status: DC | PRN

## 2019-04-01 MED ORDER — SODIUM CHLORIDE 0.9% FLUSH
3.0000 mL | Freq: Once | INTRAVENOUS | Status: AC
  Administered 2019-04-01: 22:00:00 3 mL via INTRAVENOUS

## 2019-04-01 MED ORDER — POTASSIUM CHLORIDE CRYS ER 20 MEQ PO TBCR
40.0000 meq | EXTENDED_RELEASE_TABLET | Freq: Once | ORAL | Status: AC
  Administered 2019-04-01: 23:00:00 40 meq via ORAL
  Filled 2019-04-01: qty 2

## 2019-04-01 MED ORDER — VITAMIN B-6 25 MG PO TABS: 25.0000 mg | ORAL_TABLET | Freq: Every day | ORAL | 0 refills | Status: DC

## 2019-04-01 MED ORDER — ONDANSETRON HCL 4 MG/2ML IJ SOLN
4.0000 mg | Freq: Once | INTRAMUSCULAR | Status: AC
  Administered 2019-04-01: 22:00:00 4 mg via INTRAVENOUS
  Filled 2019-04-01: qty 2

## 2019-04-01 MED ORDER — SODIUM CHLORIDE 0.9 % IV BOLUS
1000.0000 mL | Freq: Once | INTRAVENOUS | Status: AC
  Administered 2019-04-01: 22:00:00 1000 mL via INTRAVENOUS

## 2019-04-01 NOTE — ED Provider Notes (Signed)
[facility] EMERGENCY DEPARTMENT Provider Note   CSN: [id number] Arrival date & time: 04/01/19  1916     History Chief Complaint  Patient presents with  . Emesis    Annette Rodriguez is a 24 y.o. female pregnant with last menses approximately mid December who presents to the ED with a 2-week history of nausea and nonbloody vomiting.  Patient reports that she has not been able to tolerate p.o. food or liquids very well and that she has been trying her best to eat and drink small amounts to prevent her nausea symptoms.  She also endorses some mild epigastric abdominal cramping that she attributes to being hungry.  She denies any headache or dizziness, fevers or chills, recent illness, chest pain or difficulty breathing, abdominal pain, pelvic pain, vaginal bleeding or discharge, or urinary symptoms.  HPI     Past Medical History:  Diagnosis Date  . Anemia   . Cystic fibrosis carrier     Patient Active Problem List   Diagnosis Date Noted  . Post-operative state 06/07/2017  . Cystic fibrosis carrier 05/07/2017  . Anemia affecting pregnancy in third trimester 04/29/2017  . Vitamin D deficiency 04/25/2017  . Supervision of high risk pregnancy, antepartum 04/23/2017  . History of placenta abruption 04/23/2017  . Insufficient prenatal care 04/23/2017  . History of C-section 04/23/2017    Past Surgical History:  Procedure Laterality Date  . CESAREAN SECTION  05/11/2015  . CESAREAN SECTION N/A 06/07/2017   Procedure: CESAREAN SECTION;  Surgeon: Chancy Milroy, MD;  Location: [place];  Service: Obstetrics;  Laterality: N/A;     OB History    Gravida  2   Para  2   Term  1   Preterm  1   AB      Living  1     SAB      TAB      Ectopic      Multiple      Live Births  1           No family history on file.  Social History   Tobacco Use  . Smoking status: Never Smoker  . Smokeless tobacco: Never Used  Substance Use Topics  .  Alcohol use: No  . Drug use: No    Types: Marijuana    Comment: Stopped +UPT     Home Medications Prior to Admission medications   Medication Sig Start Date End Date Taking? Authorizing Provider  ibuprofen (ADVIL,MOTRIN) 800 MG tablet Take 1 tablet (800 mg total) by mouth 3 (three) times daily. 06/09/17   Julianne Handler, CNM  medroxyPROGESTERone (DEPO-PROVERA) 150 MG/ML injection Inject 1 mL (150 mg total) into the muscle every 3 (three) months. 12/22/17   Constant, Peggy, MD  ondansetron (ZOFRAN ODT) 4 MG disintegrating tablet Take 1 tablet (4 mg total) by mouth every 8 (eight) hours as needed for nausea or vomiting. 04/01/19   Corena Herter, PA-C  vitamin B-6 (PYRIDOXINE) 25 MG tablet Take 1 tablet (25 mg total) by mouth daily. 04/01/19   Corena Herter, PA-C    Allergies    Patient has no known allergies.  Review of Systems   Review of Systems  All other systems reviewed and are negative.   Physical Exam Updated Vital Signs BP 111/76   Pulse 90   Temp 98 F (36.7 C) (Oral)   Resp 18   LMP 02/16/2019 (Approximate)   SpO2 100%   Physical Exam Vitals and  nursing note reviewed. Exam conducted with a chaperone present.  Constitutional:      Appearance: Normal appearance.  HENT:     Head: Normocephalic and atraumatic.  Eyes:     General: No scleral icterus.    Conjunctiva/sclera: Conjunctivae normal.  Cardiovascular:     Rate and Rhythm: Normal rate and regular rhythm.     Pulses: Normal pulses.     Heart sounds: Normal heart sounds.  Pulmonary:     Effort: Pulmonary effort is normal. No respiratory distress.     Breath sounds: Normal breath sounds.  Abdominal:     Comments: Soft, nondistended.  Nontender to palpation throughout entirety of abdomen.  No guarding.  No overlying skin changes.  No mass appreciated.  Skin:    General: Skin is dry.     Capillary Refill: Capillary refill takes less than 2 seconds.  Neurological:     Mental Status: She is alert and  oriented to person, place, and time.     GCS: GCS eye subscore is 4. GCS verbal subscore is 5. GCS motor subscore is 6.  Psychiatric:        Mood and Affect: Mood normal.        Behavior: Behavior normal.        Thought Content: Thought content normal.     ED Results / Procedures / Treatments   Labs (all labs ordered are listed, but only abnormal results are displayed) Labs Reviewed  COMPREHENSIVE METABOLIC PANEL - Abnormal; Notable for the following components:      Result Value   Potassium 3.4 (*)    CO2 21 (*)    All other components within normal limits  CBC - Abnormal; Notable for the following components:   WBC 12.9 (*)    HCT 35.5 (*)    All other components within normal limits  URINALYSIS, ROUTINE W REFLEX MICROSCOPIC - Abnormal; Notable for the following components:   APPearance HAZY (*)    Specific Gravity, Urine 1.033 (*)    Ketones, ur 80 (*)    Protein, ur 100 (*)    Bacteria, UA RARE (*)    All other components within normal limits  I-STAT BETA HCG BLOOD, ED ([facility], [facility], [facility] ONLY) - Abnormal; Notable for the following components:   I-stat hCG, quantitative >2,000.0 (*)    All other components within normal limits  LIPASE, BLOOD    EKG None  Radiology No results found.  Procedures Procedures (including critical care time)  Medications Ordered in ED Medications  sodium chloride flush (NS) 0.9 % injection 3 mL (3 mLs Intravenous Given 04/01/19 2131)  sodium chloride 0.9 % bolus 1,000 mL (0 mLs Intravenous Stopped 04/01/19 2240)  ondansetron (ZOFRAN) injection 4 mg (4 mg Intravenous Given 04/01/19 2130)  alum & mag hydroxide-simeth (MAALOX/MYLANTA) 200-200-20 MG/5ML suspension 30 mL (30 mLs Oral Given 04/01/19 2228)  potassium chloride SA (KLOR-CON) CR tablet 40 mEq (40 mEq Oral Given 04/01/19 2239)    ED Course  I have reviewed the triage vital signs and the nursing notes.  Pertinent labs & imaging results that were available during my care of the patient  were reviewed by me and considered in my medical decision making (see chart for details).    MDM Rules/Calculators/A&P                      Patient's history and physical exam is consistent with hyperemesis gravidarum.  Her UA demonstrates dehydration with her elevated specific gravity  of 1.033 and ketonuria.  CMP demonstrates mild hypokalemia to 3.4, but otherwise is reassuring.  Physical exam was entirely benign and she had no abdominal tenderness to palpation.  She denies any pelvic or lower abdominal pain that might otherwise be concerning for ectopic pregnancy or ovarian torsion.  She also denies any vaginal bleeding that might otherwise be concerning for spontaneous abortion.  After IV fluid resuscitation with 1 L normal saline and 4 mg Zofran IV, patient is feeling significantly improved and is able to tolerate food and liquid by mouth.  Will discharge home with 25 mg p.o. pyridoxine as well as Zofran ODT if her nausea symptoms fail to improve.  Patient is still breast-feeding her 35-year-old daughter, however I informed her that I would prefer that she no longer breast-feed while on Zofran.  She voiced understanding and is agreeable with that plan.  I strongly encourage patient to call her OB physician regarding today's encounter and to notify them of our assessment and plan.  Patient has a follow-up appointment with her OB physician on 04/06/2019 which she plans to attend.    Patient was p.o. challenged while here in the ED and on reevaluation is feeling entirely improved.  Discussed with patient strict return precautions such as abdominal/pelvic pain, vaginal bleeding, fevers or chills, uncontrolled nausea vomiting, or any other new or worsening symptoms.  All of the evaluation and work-up results were discussed with the patient and any family at bedside. They were provided opportunity to ask any additional questions and have none at this time. They have expressed understanding of verbal discharge  instructions as well as return precautions and are agreeable to the plan.    Final Clinical Impression(s) / ED Diagnoses Final diagnoses:  Hyperemesis gravidarum    Rx / DC Orders ED Discharge Orders         Ordered    vitamin B-6 (PYRIDOXINE) 25 MG tablet  Daily     04/01/19 2237    ondansetron (ZOFRAN ODT) 4 MG disintegrating tablet  Every 8 hours PRN     04/01/19 2237           Lorelee New, PA-C 04/01/19 2244    Mancel Bale, MD 04/01/19 2250

## 2019-04-01 NOTE — Discharge Instructions (Addendum)
Please read the attachment on hyperemesis gravidarum.  Please take vitamin B6 (pyridoxine) every 6-8 hours for your nausea symptoms.  Please take Zofran ODT if your nausea symptoms persist despite pyridoxine.  Please do not breast-feed while taking Zofran.  Please follow-up with your [facility] physician on April 06, 2019, as scheduled.  They may decide to change your antiemetic medications for symptomatic relief of your nausea symptoms.  Please return to the ED or seek immediate medical attention should you develop any abdominal/pelvic pain, vaginal bleeding, fevers or chills, uncontrolled nausea and vomiting, or any other new or worsening symptoms

## 2019-04-01 NOTE — ED Triage Notes (Signed)
Patient reports emesis and mild low abdominal pain onset 3 weeks ago , denies diarrhea or fever .

## 2019-04-06 ENCOUNTER — Ambulatory Visit (INDEPENDENT_AMBULATORY_CARE_PROVIDER_SITE_OTHER): Payer: Medicaid Other

## 2019-04-06 ENCOUNTER — Other Ambulatory Visit: Payer: Self-pay

## 2019-04-06 VITALS — BP 114/62 | HR 101 | Temp 99.0°F | Ht 59.0 in | Wt 105.5 lb

## 2019-04-06 DIAGNOSIS — Z3201 Encounter for pregnancy test, result positive: Secondary | ICD-10-CM | POA: Diagnosis not present

## 2019-04-06 DIAGNOSIS — Z3480 Encounter for supervision of other normal pregnancy, unspecified trimester: Secondary | ICD-10-CM

## 2019-04-06 LAB — POCT URINE PREGNANCY: Preg Test, Ur: POSITIVE — AB

## 2019-04-06 MED ORDER — BLOOD PRESSURE KIT DEVI: 1.0000 | 0 refills | Status: DC

## 2019-04-06 NOTE — Progress Notes (Signed)
Annette Rodriguez presents today for UPT. She has no unusual complaints. LMP:02/16/2019    OBJECTIVE: Appears well, in no apparent distress.  OB History    Gravida  3   Para  2   Term  1   Preterm  1   AB      Living  2     SAB      TAB      Ectopic      Multiple      Live Births  1          Home UPT Result:POSITIVE In-Office UPT result:POSITIVE I have reviewed the patient's medical, obstetrical, social, and family histories, and medications.   ASSESSMENT: Positive pregnancy test  PLAN Prenatal care to be completed at: [facility]   --------------------------------------------------------- PRENATAL INTAKE SUMMARY  Annette Rodriguez presents today New OB Nurse Interview.  OB History    Gravida  3   Para  2   Term  1   Preterm  1   AB      Living  2     SAB      TAB      Ectopic      Multiple      Live Births  1          I have reviewed the patient's medical, obstetrical, social, and family histories, medications, and available lab results.  SUBJECTIVE She has no unusual complaints  OBJECTIVE Initial Physical Exam (New OB)  GENERAL APPEARANCE: alert, well appearing   ASSESSMENT Normal pregnancy  PLAN Prenatal care [facility]EM[facility] BP Cuff Ordered

## 2019-04-08 DIAGNOSIS — O0900 Supervision of pregnancy with history of infertility, unspecified trimester: Secondary | ICD-10-CM | POA: Diagnosis not present

## 2019-05-10 ENCOUNTER — Other Ambulatory Visit (HOSPITAL_COMMUNITY)
Admission: RE | Admit: 2019-05-10 | Discharge: 2019-05-10 | Disposition: A | Discharge status: Home / Self Care | Payer: Medicaid Other | Source: Ambulatory Visit | Attending: Advanced Practice Midwife | Admitting: Advanced Practice Midwife

## 2019-05-10 ENCOUNTER — Ambulatory Visit (INDEPENDENT_AMBULATORY_CARE_PROVIDER_SITE_OTHER): Payer: Medicaid Other | Admitting: Certified Nurse Midwife

## 2019-05-10 ENCOUNTER — Other Ambulatory Visit: Payer: Self-pay

## 2019-05-10 ENCOUNTER — Encounter: Payer: Self-pay | Admitting: Certified Nurse Midwife

## 2019-05-10 VITALS — BP 101/66 | HR 94 | Wt 103.0 lb

## 2019-05-10 DIAGNOSIS — Z3480 Encounter for supervision of other normal pregnancy, unspecified trimester: Secondary | ICD-10-CM | POA: Diagnosis not present

## 2019-05-10 DIAGNOSIS — Z3481 Encounter for supervision of other normal pregnancy, first trimester: Secondary | ICD-10-CM | POA: Diagnosis not present

## 2019-05-10 DIAGNOSIS — Z3A11 11 weeks gestation of pregnancy: Secondary | ICD-10-CM

## 2019-05-10 DIAGNOSIS — Z8759 Personal history of other complications of pregnancy, childbirth and the puerperium: Secondary | ICD-10-CM

## 2019-05-10 DIAGNOSIS — Z98891 History of uterine scar from previous surgery: Secondary | ICD-10-CM

## 2019-05-10 DIAGNOSIS — O219 Vomiting of pregnancy, unspecified: Secondary | ICD-10-CM

## 2019-05-10 DIAGNOSIS — O99011 Anemia complicating pregnancy, first trimester: Secondary | ICD-10-CM

## 2019-05-10 DIAGNOSIS — O99019 Anemia complicating pregnancy, unspecified trimester: Secondary | ICD-10-CM

## 2019-05-10 MED ORDER — ONDANSETRON 4 MG PO TBDP: 4.0000 mg | ORAL_TABLET | Freq: Three times a day (TID) | ORAL | 3 refills | Status: DC | PRN

## 2019-05-10 NOTE — Patient Instructions (Signed)
Safe Medications in Pregnancy   Acne: Benzoyl Peroxide Salicylic Acid  Backache/Headache: Tylenol: 2 regular strength every 4 hours OR              2 Extra strength every 6 hours  Colds/Coughs/Allergies: Benadryl (alcohol free) 25 mg every 6 hours as needed Breath right strips Claritin Cepacol throat lozenges Chloraseptic throat spray Cold-Eeze- up to three times per day Cough drops, alcohol free Flonase (by prescription only) Guaifenesin Mucinex Robitussin DM (plain only, alcohol free) Saline nasal spray/drops Sudafed (pseudoephedrine) & Actifed ** use only after [redacted] weeks gestation and if you do not have high blood pressure Tylenol Vicks Vaporub Zinc lozenges Zyrtec   Constipation: Colace Ducolax suppositories Fleet enema Glycerin suppositories Metamucil Milk of magnesia Miralax Senokot Smooth move tea  Diarrhea: Kaopectate Imodium A-D  *NO pepto Bismol  Hemorrhoids: Anusol Anusol HC Preparation H Tucks  Indigestion: Tums Maalox Mylanta Zantac  Pepcid  Insomnia: Benadryl (alcohol free) 25mg every 6 hours as needed Tylenol PM Unisom, no Gelcaps  Leg Cramps: Tums MagGel  Nausea/Vomiting:  Bonine Dramamine Emetrol Ginger extract Sea bands Meclizine  Nausea medication to take during pregnancy:  Unisom (doxylamine succinate 25 mg tablets) Take one tablet daily at bedtime. If symptoms are not adequately controlled, the dose can be increased to a maximum recommended dose of two tablets daily (1/2 tablet in the morning, 1/2 tablet mid-afternoon and one at bedtime). Vitamin B6 100mg tablets. Take one tablet twice a day (up to 200 mg per day).  Skin Rashes: Aveeno products Benadryl cream or 25mg every 6 hours as needed Calamine Lotion 1% cortisone cream  Yeast infection: Gyne-lotrimin 7 Monistat 7   **If taking multiple medications, please check labels to avoid duplicating the same active ingredients **take medication as directed on  the label ** Do not exceed 4000 mg of tylenol in 24 hours **Do not take medications that contain aspirin or ibuprofen     First Trimester of Pregnancy  The first trimester of pregnancy is from week 1 until the end of week 13 (months 1 through 3). During this time, your baby will begin to develop inside you. At 6-8 weeks, the eyes and face are formed, and the heartbeat can be seen on ultrasound. At the end of 12 weeks, all the baby's organs are formed. Prenatal care is all the medical care you receive before the birth of your baby. Make sure you get good prenatal care and follow all of your doctor's instructions. Follow these instructions at home: Medicines  Take over-the-counter and prescription medicines only as told by your doctor. Some medicines are safe and some medicines are not safe during pregnancy.  Take a prenatal vitamin that contains at least 600 micrograms (mcg) of folic acid.  If you have trouble pooping (constipation), take medicine that will make your stool soft (stool softener) if your doctor approves. Eating and drinking   Eat regular, healthy meals.  Your doctor will tell you the amount of weight gain that is right for you.  Avoid raw meat and uncooked cheese.  If you feel sick to your stomach (nauseous) or throw up (vomit): ? Eat 4 or 5 small meals a day instead of 3 large meals. ? Try eating a few soda crackers. ? Drink liquids between meals instead of during meals.  To prevent constipation: ? Eat foods that are high in fiber, like fresh fruits and vegetables, whole grains, and beans. ? Drink enough fluids to keep your pee (urine) clear or pale yellow.   Activity  Exercise only as told by your doctor. Stop exercising if you have cramps or pain in your lower belly (abdomen) or low back.  Do not exercise if it is too hot, too humid, or if you are in a place of great height (high altitude).  Try to avoid standing for long periods of time. Move your legs often  if you must stand in one place for a long time.  Avoid heavy lifting.  Wear low-heeled shoes. Sit and stand up straight.  You can have sex unless your doctor tells you not to. Relieving pain and discomfort  Wear a good support bra if your breasts are sore.  Take warm water baths (sitz baths) to soothe pain or discomfort caused by hemorrhoids. Use hemorrhoid cream if your doctor says it is okay.  Rest with your legs raised if you have leg cramps or low back pain.  If you have puffy, bulging veins (varicose veins) in your legs: ? Wear support hose or compression stockings as told by your doctor. ? Raise (elevate) your feet for 15 minutes, 3-4 times a day. ? Limit salt in your food. Prenatal care  Schedule your prenatal visits by the twelfth week of pregnancy.  Write down your questions. Take them to your prenatal visits.  Keep all your prenatal visits as told by your doctor. This is important. Safety  Wear your seat belt at all times when driving.  Make a list of emergency phone numbers. The list should include numbers for family, friends, the hospital, and police and fire departments. General instructions  Ask your doctor for a referral to a local prenatal class. Begin classes no later than at the start of month 6 of your pregnancy.  Ask for help if you need counseling or if you need help with nutrition. Your doctor can give you advice or tell you where to go for help.  Do not use hot tubs, steam rooms, or saunas.  Do not douche or use tampons or scented sanitary pads.  Do not cross your legs for long periods of time.  Avoid all herbs and alcohol. Avoid drugs that are not approved by your doctor.  Do not use any tobacco products, including cigarettes, chewing tobacco, and electronic cigarettes. If you need help quitting, ask your doctor. You may get counseling or other support to help you quit.  Avoid cat litter boxes and soil used by cats. These carry germs that can  cause birth defects in the baby and can cause a loss of your baby (miscarriage) or stillbirth.  Visit your dentist. At home, brush your teeth with a soft toothbrush. Be gentle when you floss. Contact a doctor if:  You are dizzy.  You have mild cramps or pressure in your lower belly.  You have a nagging pain in your belly area.  You continue to feel sick to your stomach, you throw up, or you have watery poop (diarrhea).  You have a bad smelling fluid coming from your vagina.  You have pain when you pee (urinate).  You have increased puffiness (swelling) in your face, hands, legs, or ankles. Get help right away if:  You have a fever.  You are leaking fluid from your vagina.  You have spotting or bleeding from your vagina.  You have very bad belly cramping or pain.  You gain or lose weight rapidly.  You throw up blood. It may look like coffee grounds.  You are around people who have German measles, fifth disease,   or chickenpox.  You have a very bad headache.  You have shortness of breath.  You have any kind of trauma, such as from a fall or a car accident. Summary  The first trimester of pregnancy is from week 1 until the end of week 13 (months 1 through 3).  To take care of yourself and your unborn baby, you will need to eat healthy meals, take medicines only if your doctor tells you to do so, and do activities that are safe for you and your baby.  Keep all follow-up visits as told by your doctor. This is important as your doctor will have to ensure that your baby is healthy and growing well. This information is not intended to replace advice given to you by your health care provider. Make sure you discuss any questions you have with your health care provider. Document Revised: 06/11/2018 Document Reviewed: 02/27/2016 Elsevier Patient Education  2020 Elsevier Inc.  

## 2019-05-10 NOTE — Progress Notes (Signed)
New OB.  FLU shot declined.  C/o NV x 30++ days.  Needs refill on Zofran.

## 2019-05-10 NOTE — Progress Notes (Signed)
History:   Annette Rodriguez is a 24 y.o. [id number] at 38w6dby LMP being seen today for her first obstetrical visit.  Her obstetrical history is significant for hx of placenta abruption . Patient does intend to breast feed- pump and feed from bottle. Pregnancy history fully reviewed.  Patient reports nausea and vomiting.     HISTORY: OB History  Gravida Para Term Preterm AB Living  '3 2 1 1 ' 0 2  SAB TAB Ectopic Multiple Live Births  0 0 0 0 1    # Outcome Date GA Lbr Len/2nd Weight Sex Delivery Anes PTL Lv  3 Current           2 Term 06/07/17 378w4d       1 Preterm 05/11/15 3282w0d lb 15 oz (1.786 kg) F CS-LTranv EPI Y LIV     Complications: Placental abruption    Last pap smear was done 04/2017 and was normal  Past Medical History:  Diagnosis Date  . Anemia   . Anemia affecting pregnancy in third trimester 04/29/2017   H 9.6 04/29/17  . Cystic fibrosis carrier   . Headache   . Insufficient prenatal care 04/23/2017   NOB visit at 31 weeks per patient.  S<D.    . Post-operative state 06/07/2017  . Supervision of high risk pregnancy, antepartum 04/23/2017    Clinic  [facility]  Prenatal Labs Dating LMP  Blood type: O/Positive/-- (02/20 1347)  Genetic Screen  AFP:          NIPS:Panorama: Neg Antibody:Negative (02/20 1347) Anatomic US [place]ubella: 3.02 (02/20 1347) GTT   Third trimester: WNL RPR: Non Reactive (02/26 1108)  Flu vaccine Declined 04/23/17 HBsAg: Negative (02/20 1347)  TDaP vaccine 04/23/17                                  Rhogam:n/a O+ HIV: Non Re  . Vaginal Pap smear, abnormal   . Vitamin D deficiency 04/25/2017   Past Surgical History:  Procedure Laterality Date  . CESAREAN SECTION  05/11/2015  . CESAREAN SECTION N/A 06/07/2017   Procedure: CESAREAN SECTION;  Surgeon: ErvChancy MilroyD;  [facility];  Laterality: N/A;   Family History  Problem Relation Age of Onset  . Asthma Maternal Grandmother    Social History   Tobacco Use  .  Smoking status: Never Smoker  . Smokeless tobacco: Never Used  Substance Use Topics  . Alcohol use: No  . Drug use: No    Comment: Stopped +UPT    No Known Allergies Current Outpatient Medications on File Prior to Visit  Medication Sig Dispense Refill  . Blood Pressure Monitoring (BLOOD PRESSURE KIT) DEVI 1 kit by Does not apply route once a week. Check Blood Pressure regularly and record readings into the Babyscripts App.  Large Cuff.  DX O90.0 1 each 0  . vitamin B-6 (PYRIDOXINE) 25 MG tablet Take 1 tablet (25 mg total) by mouth daily. 30 tablet 0   No current facility-administered medications on file prior to visit.    Review of Systems Pertinent items noted in HPI and remainder of comprehensive ROS otherwise negative. Physical Exam:   Vitals:   05/10/19 1422  BP: 101/66  Pulse: 94  Weight: 103 lb (46.7 kg)   Fetal Heart Rate (bpm): 152 Pelvic Exam: Perineum: no hemorrhoids, normal perineum   Vulva: normal external genitalia, no  lesions   Adnexa: normal adnexa and no mass, fullness, tenderness   Bony Pelvis: average  System: General: well-developed, well-nourished female in no acute distress   Breasts:  normal appearance, no masses or tenderness bilaterally   Skin: normal coloration and turgor, no rashes   Neurologic: oriented, normal, negative, normal mood   Extremities: normal strength, tone, and muscle mass, ROM of all joints is normal   HEENT PERRLA, extraocular movement intact and sclera clear, anicteric   Mouth/Teeth mucous membranes moist, pharynx normal without lesions and dental hygiene good   Neck supple and no masses   Cardiovascular: regular rate and rhythm   Respiratory:  no respiratory distress, normal breath sounds   Abdomen: soft, non-tender; bowel sounds normal; no masses,  no organomegaly     Assessment:    Pregnancy: [id number] Patient Active Problem List   Diagnosis Date Noted  . Supervision of other normal pregnancy, antepartum 04/06/2019  .  Cystic fibrosis carrier 05/07/2017  . History of placenta abruption 04/23/2017  . History of C-section 04/23/2017     Plan:    1. Supervision of other normal pregnancy, antepartum - Welcomed back to practice and introduced self to patient  - Reviewed safety, visitor policy, reassurance about COVID-19 for pregnancy at this time. Discussed possible changes to visits, including televisits, that may occur due to COVID-19.  The office remains open if pt needs to be seen and [facility] is open 24 hours/day for OB emergencies. - Anticipatory guidance on upcoming appointments with the integration of virtual and in person  - Enroll Patient in Babyscripts - Babyscripts Schedule Optimization - Genetic Screening - Obstetric Panel, Including HIV - Culture, OB Urine - Cervicovaginal ancillary only( [place]) - [place] MFM OB COMP + 14 WK; Future  2. History of C-section - Hx of C/S x2, needs repeat   3. History of placenta abruption - Hx of placental abruption with first pregnancy in 1173 - No other complications during pregnancy   4. Nausea and vomiting during pregnancy - Refill Rx sent to pharmacy of choice  - ondansetron (ZOFRAN ODT) 4 MG disintegrating tablet; Take 1 tablet (4 mg total) by mouth every 8 (eight) hours as needed for nausea or vomiting.  Dispense: 30 tablet; Refill: 3   Initial labs drawn. Continue prenatal vitamins. Genetic Screening discussed, NIPS: ordered. Ultrasound discussed; fetal anatomic survey: ordered. Problem list reviewed and updated. The nature of [facility] with multiple MDs and other Advanced Practice Providers was explained to patient; also emphasized that residents, students are part of our team. Routine obstetric precautions reviewed. Return in about 4 weeks (around 06/07/2019) for ROB/AFP.     Lajean Manes, [place] for [employer], [place]

## 2019-05-11 ENCOUNTER — Telehealth: Payer: Self-pay

## 2019-05-11 LAB — OBSTETRIC PANEL, INCLUDING HIV
Antibody Screen: NEGATIVE
Basophils Absolute: 0 10*3/uL (ref 0.0–0.2)
Basos: 0 %
EOS (ABSOLUTE): 0.1 10*3/uL (ref 0.0–0.4)
Eos: 1 %
HIV Screen 4th Generation wRfx: NONREACTIVE
Hematocrit: 30.5 % — ABNORMAL LOW (ref 34.0–46.6)
Hemoglobin: 10.2 g/dL — ABNORMAL LOW (ref 11.1–15.9)
Hepatitis B Surface Ag: NEGATIVE
Immature Grans (Abs): 0.1 10*3/uL (ref 0.0–0.1)
Immature Granulocytes: 1 %
Lymphocytes Absolute: 3.2 10*3/uL — ABNORMAL HIGH (ref 0.7–3.1)
Lymphs: 27 %
MCH: 28.8 pg (ref 26.6–33.0)
MCHC: 33.4 g/dL (ref 31.5–35.7)
MCV: 86 fL (ref 79–97)
Monocytes Absolute: 0.7 10*3/uL (ref 0.1–0.9)
Monocytes: 6 %
Neutrophils Absolute: 7.8 10*3/uL — ABNORMAL HIGH (ref 1.4–7.0)
Neutrophils: 65 %
Platelets: 296 10*3/uL (ref 150–450)
RBC: 3.54 x10E6/uL — ABNORMAL LOW (ref 3.77–5.28)
RDW: 12.7 % (ref 11.7–15.4)
RPR Ser Ql: NONREACTIVE
Rh Factor: POSITIVE
Rubella Antibodies, IGG: 6.41 index (ref >0.99)
WBC: 11.8 10*3/uL — ABNORMAL HIGH (ref 3.4–10.8)

## 2019-05-11 LAB — CERVICOVAGINAL ANCILLARY ONLY
Chlamydia: NEGATIVE
Comment: NEGATIVE
Comment: NEGATIVE
Comment: NORMAL
Neisseria Gonorrhea: NEGATIVE
Trichomonas: NEGATIVE

## 2019-05-11 MED ORDER — FERROUS GLUCONATE 324 (38 FE) MG PO TABS: 324.0000 mg | ORAL_TABLET | Freq: Every day | ORAL | 3 refills | Status: DC

## 2019-05-11 NOTE — Addendum Note (Signed)
Addended by: Sharyon Cable on: 05/11/2019 01:16 PM   Modules accepted: Orders

## 2019-05-11 NOTE — Telephone Encounter (Signed)
Attempted to contact about results and rx sent, no answer, vm not setup

## 2019-05-12 ENCOUNTER — Telehealth: Payer: Self-pay

## 2019-05-12 LAB — URINE CULTURE, OB REFLEX

## 2019-05-12 LAB — CULTURE, OB URINE

## 2019-05-12 NOTE — Telephone Encounter (Signed)
Attempted to contact again about results and rx, no answer, vm not setup.

## 2019-05-15 ENCOUNTER — Other Ambulatory Visit: Payer: Self-pay

## 2019-05-15 ENCOUNTER — Encounter (HOSPITAL_COMMUNITY): Payer: Self-pay

## 2019-05-15 ENCOUNTER — Emergency Department (HOSPITAL_COMMUNITY)
Admission: EM | Admit: 2019-05-15 | Discharge: 2019-05-15 | Disposition: A | Discharge status: Home / Self Care | Payer: Medicaid Other | Attending: Emergency Medicine | Admitting: Emergency Medicine

## 2019-05-15 DIAGNOSIS — L0501 Pilonidal cyst with abscess: Secondary | ICD-10-CM | POA: Insufficient documentation

## 2019-05-15 DIAGNOSIS — R222 Localized swelling, mass and lump, trunk: Secondary | ICD-10-CM | POA: Diagnosis present

## 2019-05-15 DIAGNOSIS — Z79899 Other long term (current) drug therapy: Secondary | ICD-10-CM | POA: Diagnosis not present

## 2019-05-15 MED ORDER — LIDOCAINE-EPINEPHRINE (PF) 2 %-1:200000 IJ SOLN
10.0000 mL | Freq: Once | INTRAMUSCULAR | Status: AC
  Administered 2019-05-15: 10 mL
  Filled 2019-05-15: qty 20

## 2019-05-15 NOTE — ED Triage Notes (Signed)
Pt arrives to ED w/ c/o abscess on tailbone. Rates pain 10/10.

## 2019-05-15 NOTE — ED Notes (Signed)
Discharge instructions discussed with pt. Pt verbalized understanding with no questions at this time. Pt to follow up within 2 days to remove packing.

## 2019-05-15 NOTE — Discharge Instructions (Addendum)
Packing was left in place that will need to be removed in 2 days. Please call your doctor to make that appointment and, if unavailable, return to the emergency department for recheck.   Tylenol for pain as needed.

## 2019-05-15 NOTE — ED Provider Notes (Signed)
[facility] EMERGENCY DEPARTMENT Provider Note   CSN: [id number] Arrival date & time: 05/15/19  0355     History Chief Complaint  Patient presents with  . Abscess    Annette Rodriguez is a 24 y.o. female.  Patient to ED with recurrent painful swelling at the pilonidal area. Current episode started 2 days ago. No drainage. She reports there has never been drainage with previous episodes. She has never had the symptoms evaluated. She is concerned that the symptoms are related to herpes. No lesion or blistering. No fever.   The history is provided by the patient. No language interpreter was used.  Abscess Associated symptoms: no fever        Past Medical History:  Diagnosis Date  . Anemia   . Anemia affecting pregnancy in third trimester 04/29/2017   H 9.6 04/29/17  . Cystic fibrosis carrier   . Headache   . Insufficient prenatal care 04/23/2017   NOB visit at 31 weeks per patient.  S<D.    . Post-operative state 06/07/2017  . Supervision of high risk pregnancy, antepartum 04/23/2017    Clinic  [facility]  Prenatal Labs Dating LMP  Blood type: O/Positive/-- (02/20 1347)  Genetic Screen  AFP:          NIPS:Panorama: Neg Antibody:Negative (02/20 1347) Anatomic [place]  Rubella: 3.02 (02/20 1347) GTT   Third trimester: WNL RPR: Non Reactive (02/26 1108)  Flu vaccine Declined 04/23/17 HBsAg: Negative (02/20 1347)  TDaP vaccine 04/23/17                                  Rhogam:n/a O+ HIV: Non Re  . Vaginal Pap smear, abnormal   . Vitamin D deficiency 04/25/2017    Patient Active Problem List   Diagnosis Date Noted  . Supervision of other normal pregnancy, antepartum 04/06/2019  . Cystic fibrosis carrier 05/07/2017  . Anemia during pregnancy 04/29/2017  . History of placenta abruption 04/23/2017  . History of C-section 04/23/2017    Past Surgical History:  Procedure Laterality Date  . CESAREAN SECTION  05/11/2015  . CESAREAN SECTION N/A 06/07/2017   Procedure: CESAREAN SECTION;   Surgeon: Chancy Milroy, MD;  Location: [facility];  Service: Obstetrics;  Laterality: N/A;     OB History    Gravida  3   Para  2   Term  1   Preterm  1   AB      Living  2     SAB      TAB      Ectopic      Multiple      Live Births  2           Family History  Problem Relation Age of Onset  . Asthma Maternal Grandmother     Social History   Tobacco Use  . Smoking status: Never Smoker  . Smokeless tobacco: Never Used  Substance Use Topics  . Alcohol use: No  . Drug use: No    Comment: Stopped +UPT     Home Medications Prior to Admission medications   Medication Sig Start Date End Date Taking? Authorizing Provider  Blood Pressure Monitoring (BLOOD PRESSURE KIT) DEVI 1 kit by Does not apply route once a week. Check Blood Pressure regularly and record readings into the Babyscripts App.  Large Cuff.  DX O90.0 04/06/19   Shelly Bombard, MD  ferrous gluconate (  FERGON) 324 MG tablet Take 1 tablet (324 mg total) by mouth daily with breakfast. 05/11/19   Lajean Manes, CNM  ondansetron (ZOFRAN ODT) 4 MG disintegrating tablet Take 1 tablet (4 mg total) by mouth every 8 (eight) hours as needed for nausea or vomiting. 05/10/19   Lajean Manes, CNM  vitamin B-6 (PYRIDOXINE) 25 MG tablet Take 1 tablet (25 mg total) by mouth daily. 04/01/19   Corena Herter, PA-C    Allergies    Patient has no known allergies.  Review of Systems   Review of Systems  Constitutional: Negative for fever.  Skin: Negative for rash.       See HPI.    Physical Exam Updated Vital Signs BP 117/69 (BP Location: Right Arm)   Pulse 95   Temp 98.2 F (36.8 C) (Oral)   Resp 18   LMP 02/16/2019 (Approximate)   SpO2 100%   Physical Exam Vitals and nursing note reviewed.  Constitutional:      Appearance: She is well-developed.  Pulmonary:     Effort: Pulmonary effort is normal.  Musculoskeletal:     Cervical back: Normal range of motion.  Skin:    General:  Skin is warm and dry.     Comments: Fluctuant tender swelling at the pilonidal buttock on right. No surrounding erythema.   Neurological:     Mental Status: She is alert and oriented to person, place, and time.     ED Results / Procedures / Treatments   Labs (all labs ordered are listed, but only abnormal results are displayed) Labs Reviewed - No data to display  EKG None  Radiology No results found.  Procedures .Marland KitchenIncision and Drainage  Date/Time: 05/15/2019 6:18 AM Performed by: Charlann Lange, PA-C Authorized by: Charlann Lange, PA-C   Consent:    Consent obtained:  Verbal   Consent given by:  Patient Location:    Type:  Pilonidal cyst   Size:  4   Location:  Lower extremity   Lower extremity location:  Buttock   Buttock location:  R buttock Pre-procedure details:    Skin preparation:  Betadine Procedure type:    Complexity:  Simple Procedure details:    Needle aspiration: no     Incision types:  Stab incision   Scalpel blade:  11   Wound management:  Probed and deloculated and irrigated with saline   Drainage:  Purulent   Drainage amount:  Moderate   Packing materials:  1/4 in iodoform gauze Post-procedure details:    Patient tolerance of procedure:  Tolerated well, no immediate complications   (including critical care time)  Medications Ordered in ED Medications  lidocaine-EPINEPHrine (XYLOCAINE W/EPI) 2 %-1:200000 (PF) injection 10 mL (10 mLs Infiltration Given 05/15/19 0515)    ED Course  I have reviewed the triage vital signs and the nursing notes.  Pertinent labs & imaging results that were available during my care of the patient were reviewed by me and considered in my medical decision making (see chart for details).    MDM Rules/Calculators/A&P                      Patient to ED with pilonidal abscess. States symptoms are recurrent.   She is well appearing. Abscess I&D'd as per above note. She will have to take Tylenol only for pain as she is  pregnant. Patient understands this.   Stable for discharge. Packing in place with recommendation to have removed by PCP in 2  days.   Final Clinical Impression(s) / ED Diagnoses Final diagnoses:  None   1. Pilonidal abscess  Rx / DC Orders ED Discharge Orders    None       Charlann Lange, PA-C 05/15/19 1657    Ward, Delice Bison, DO 05/15/19 [phone]

## 2019-05-16 ENCOUNTER — Other Ambulatory Visit: Payer: Self-pay

## 2019-05-16 ENCOUNTER — Encounter (HOSPITAL_COMMUNITY): Payer: Self-pay | Admitting: Emergency Medicine

## 2019-05-16 ENCOUNTER — Emergency Department (HOSPITAL_COMMUNITY)
Admission: EM | Admit: 2019-05-16 | Discharge: 2019-05-16 | Disposition: A | Discharge status: Home / Self Care | Payer: Medicaid Other | Attending: Emergency Medicine | Admitting: Emergency Medicine

## 2019-05-16 DIAGNOSIS — Z4801 Encounter for change or removal of surgical wound dressing: Secondary | ICD-10-CM | POA: Insufficient documentation

## 2019-05-16 DIAGNOSIS — K610 Anal abscess: Secondary | ICD-10-CM | POA: Insufficient documentation

## 2019-05-16 DIAGNOSIS — Z4800 Encounter for change or removal of nonsurgical wound dressing: Secondary | ICD-10-CM

## 2019-05-16 DIAGNOSIS — Z79899 Other long term (current) drug therapy: Secondary | ICD-10-CM | POA: Diagnosis not present

## 2019-05-16 NOTE — ED Notes (Signed)
Verbalized understanding of DC instructions and follow up care 

## 2019-05-16 NOTE — ED Triage Notes (Signed)
Pt seen in ED yesterday morning for I&D and packing of pilonidal abscess.  C/o 10/10 pain.

## 2019-05-16 NOTE — Discharge Instructions (Addendum)
Soak area 20 minutes 4 times a day °

## 2019-05-16 NOTE — ED Provider Notes (Addendum)
[place] [facility] EMERGENCY DEPARTMENT Provider Note   CSN: [id number] Arrival date & time: 05/16/19  1614     History Chief Complaint  Patient presents with  . Abscess    Annette Rodriguez is a 24 y.o. female.  The history is provided by the patient. No language interpreter was used.  Abscess Location:  Pelvis Pelvic abscess location:  Perianal Abscess quality: redness   Progression:  Worsening Chronicity:  New Worsened by:  Nothing Ineffective treatments:  None tried Pt complains of pain from the packing.  Pt is requesting packing removal      Past Medical History:  Diagnosis Date  . Anemia   . Anemia affecting pregnancy in third trimester 04/29/2017   H 9.6 04/29/17  . Cystic fibrosis carrier   . Headache   . Insufficient prenatal care 04/23/2017   NOB visit at 31 weeks per patient.  S<D.    . Post-operative state 06/07/2017  . Supervision of high risk pregnancy, antepartum 04/23/2017    Clinic  [facility]  Prenatal Labs Dating LMP  Blood type: O/Positive/-- (02/20 1347)  Genetic Screen  AFP:          NIPS:Panorama: Neg Antibody:Negative (02/20 1347) Anatomic [place]  Rubella: 3.02 (02/20 1347) GTT   Third trimester: WNL RPR: Non Reactive (02/26 1108)  Flu vaccine Declined 04/23/17 HBsAg: Negative (02/20 1347)  TDaP vaccine 04/23/17                                  Rhogam:n/a O+ HIV: Non Re  . Vaginal Pap smear, abnormal   . Vitamin D deficiency 04/25/2017    Patient Active Problem List   Diagnosis Date Noted  . Supervision of other normal pregnancy, antepartum 04/06/2019  . Cystic fibrosis carrier 05/07/2017  . Anemia during pregnancy 04/29/2017  . History of placenta abruption 04/23/2017  . History of C-section 04/23/2017    Past Surgical History:  Procedure Laterality Date  . CESAREAN SECTION  05/11/2015  . CESAREAN SECTION N/A 06/07/2017   Procedure: CESAREAN SECTION;  Surgeon: Chancy Milroy, MD;  Location: [facility];  Service: Obstetrics;   Laterality: N/A;     OB History    Gravida  3   Para  2   Term  1   Preterm  1   AB      Living  2     SAB      TAB      Ectopic      Multiple      Live Births  2           Family History  Problem Relation Age of Onset  . Asthma Maternal Grandmother     Social History   Tobacco Use  . Smoking status: Never Smoker  . Smokeless tobacco: Never Used  Substance Use Topics  . Alcohol use: No  . Drug use: No    Comment: Stopped +UPT     Home Medications Prior to Admission medications   Medication Sig Start Date End Date Taking? Authorizing Provider  Blood Pressure Monitoring (BLOOD PRESSURE KIT) DEVI 1 kit by Does not apply route once a week. Check Blood Pressure regularly and record readings into the [employer] App.  Large Cuff.  DX O90.0 04/06/19   Shelly Bombard, MD  ferrous gluconate (FERGON) 324 MG tablet Take 1 tablet (324 mg total) by mouth daily with breakfast. 05/11/19   Stann Mainland,  Katheran James, CNM  ondansetron (ZOFRAN ODT) 4 MG disintegrating tablet Take 1 tablet (4 mg total) by mouth every 8 (eight) hours as needed for nausea or vomiting. 05/10/19   Lajean Manes, CNM  vitamin B-6 (PYRIDOXINE) 25 MG tablet Take 1 tablet (25 mg total) by mouth daily. 04/01/19   Corena Herter, PA-C    Allergies    Patient has no known allergies.  Review of Systems   Review of Systems  All other systems reviewed and are negative.   Physical Exam Updated Vital Signs BP 135/73 (BP Location: Right Arm)   Pulse (!) 106   Temp 98.5 F (36.9 C) (Oral)   Resp 18   LMP 02/16/2019 (Approximate)   SpO2 100%   Physical Exam Vitals reviewed.  Constitutional:      Appearance: Normal appearance.  Cardiovascular:     Rate and Rhythm: Normal rate.  Pulmonary:     Effort: Pulmonary effort is normal.  Skin:    General: Skin is warm.     Capillary Refill: Capillary refill takes less than 2 seconds.     Comments: Packing in incision, removed easily,  Small amount of  continued drainage  Neurological:     General: No focal deficit present.     Mental Status: She is alert.  Psychiatric:        Mood and Affect: Mood normal.     ED Results / Procedures / Treatments   Labs (all labs ordered are listed, but only abnormal results are displayed) Labs Reviewed - No data to display  EKG None  Radiology No results found.  Procedures Procedures (including critical care time)  Medications Ordered in ED Medications - No data to display  ED Course  I have reviewed the triage vital signs and the nursing notes.  Pertinent labs & imaging results that were available during my care of the patient were reviewed by me and considered in my medical decision making (see chart for details).    MDM Rules/Calculators/A&P                      MDM:  Pt advised to soak area to keep wound draining.  Return if any problems  Final Clinical Impression(s) / ED Diagnoses Final diagnoses:  Abscess packing removal    Rx / DC Orders ED Discharge Orders    None       Sidney Ace 05/16/19 1743    Fransico Meadow, PA-C 05/16/19 1744    Wyvonnia Dusky, MD 05/17/19 [id number]

## 2019-05-17 ENCOUNTER — Encounter: Payer: Self-pay | Admitting: Certified Nurse Midwife

## 2019-06-07 ENCOUNTER — Encounter: Payer: Self-pay | Admitting: Certified Nurse Midwife

## 2019-06-07 ENCOUNTER — Other Ambulatory Visit: Payer: Self-pay

## 2019-06-07 ENCOUNTER — Encounter: Payer: Self-pay | Admitting: Obstetrics

## 2019-06-07 ENCOUNTER — Ambulatory Visit (INDEPENDENT_AMBULATORY_CARE_PROVIDER_SITE_OTHER): Payer: Medicaid Other | Admitting: Obstetrics

## 2019-06-07 VITALS — BP 103/60 | HR 99 | Wt 100.1 lb

## 2019-06-07 DIAGNOSIS — Z141 Cystic fibrosis carrier: Secondary | ICD-10-CM

## 2019-06-07 DIAGNOSIS — Z3480 Encounter for supervision of other normal pregnancy, unspecified trimester: Secondary | ICD-10-CM | POA: Diagnosis not present

## 2019-06-07 DIAGNOSIS — D563 Thalassemia minor: Secondary | ICD-10-CM

## 2019-06-07 DIAGNOSIS — O34219 Maternal care for unspecified type scar from previous cesarean delivery: Secondary | ICD-10-CM

## 2019-06-07 DIAGNOSIS — E669 Obesity, unspecified: Secondary | ICD-10-CM

## 2019-06-07 DIAGNOSIS — Z8759 Personal history of other complications of pregnancy, childbirth and the puerperium: Secondary | ICD-10-CM

## 2019-06-07 DIAGNOSIS — O219 Vomiting of pregnancy, unspecified: Secondary | ICD-10-CM

## 2019-06-07 DIAGNOSIS — O99019 Anemia complicating pregnancy, unspecified trimester: Secondary | ICD-10-CM

## 2019-06-07 DIAGNOSIS — Z3A15 15 weeks gestation of pregnancy: Secondary | ICD-10-CM

## 2019-06-07 DIAGNOSIS — Z98891 History of uterine scar from previous surgery: Secondary | ICD-10-CM

## 2019-06-07 MED ORDER — VITAFOL GUMMIES 3.33-0.333-34.8 MG PO CHEW: 3.0000 | CHEWABLE_TABLET | Freq: Every day | ORAL | 11 refills | Status: DC

## 2019-06-07 MED ORDER — DOXYLAMINE-PYRIDOXINE 10-10 MG PO TBEC: DELAYED_RELEASE_TABLET | ORAL | 5 refills | Status: DC

## 2019-06-07 MED ORDER — ASPIRIN 81 MG PO CHEW: 81.0000 mg | CHEWABLE_TABLET | Freq: Every day | ORAL | 6 refills | Status: DC

## 2019-06-07 NOTE — Progress Notes (Addendum)
Subjective:  Annette Rodriguez is a 24 y.o. G3P1102 at [redacted]w[redacted]d being seen today for ongoing prenatal care.  She is currently monitored for the following issues for this low-risk pregnancy and has History of placenta abruption; History of C-section; Anemia during pregnancy; Cystic fibrosis carrier; and Supervision of other normal pregnancy, antepartum on their problem list.  Patient reports nausea and vomiting.  Contractions: Not present. Vag. Bleeding: None.  Movement: Present. Denies leaking of fluid.   The following portions of the patient's history were reviewed and updated as appropriate: allergies, current medications, past family history, past medical history, past social history, past surgical history and problem list. Problem list updated.  Objective:   Vitals:   06/07/19 1547  BP: 103/60  Pulse: 99  Weight: 100 lb 1.6 oz (45.4 kg)    Fetal Status:     Movement: Present     General:  Alert, oriented and cooperative. Patient is in no acute distress.  Skin: Skin is warm and dry. No rash noted.   Cardiovascular: Normal heart rate noted  Respiratory: Normal respiratory effort, no problems with respiration noted  Abdomen: Soft, gravid, appropriate for gestational age. Pain/Pressure: Absent     Pelvic:  Cervical exam deferred        Extremities: Normal range of motion.     Mental Status: Normal mood and affect. Normal behavior. Normal judgment and thought content.   Urinalysis:      Assessment and Plan:  Pregnancy: G3P1102 at [redacted]w[redacted]d  1. Supervision of other normal pregnancy, antepartum Rx: - AFP, Serum, Open Spina Bifida - Prenatal Vit-Fe Phos-FA-Omega (VITAFOL GUMMIES) 3.33-0.333-34.8 MG CHEW; Chew 3 tablets by mouth daily before breakfast.  Dispense: 90 tablet; Refill: 11  2. History of placenta abruption Rx: - aspirin 81 MG chewable tablet; Chew 1 tablet (81 mg total) by mouth daily.  Dispense: 30 tablet; Refill: 6  3. History of C-section  4. Anemia during pregnancy - not  taking iron because of nausea  5. Nausea and vomiting during pregnancy Rx: - Doxylamine-Pyridoxine (DICLEGIS) 10-10 MG TBEC; 1 tab in AM, 1 tab mid afternoon 2 tabs at bedtime. Max dose 4 tabs daily.  Dispense: 100 tablet; Refill: 5 - Ensure 1 po tid with meals Rx through [facility]  Preterm labor symptoms and general obstetric precautions including but not limited to vaginal bleeding, contractions, leaking of fluid and fetal movement were reviewed in detail with the patient. Please refer to After Visit Summary for other counseling recommendations.    Brock Bad, MD  06/07/2019

## 2019-06-07 NOTE — Progress Notes (Signed)
Pt is here for ROB, [redacted]w[redacted]d. Pt has c/o severe N&V.

## 2019-06-09 ENCOUNTER — Encounter: Payer: Self-pay | Admitting: Certified Nurse Midwife

## 2019-06-09 DIAGNOSIS — D563 Thalassemia minor: Secondary | ICD-10-CM | POA: Insufficient documentation

## 2019-06-09 DIAGNOSIS — O09899 Supervision of other high risk pregnancies, unspecified trimester: Secondary | ICD-10-CM | POA: Insufficient documentation

## 2019-06-09 DIAGNOSIS — Z141 Cystic fibrosis carrier: Secondary | ICD-10-CM | POA: Insufficient documentation

## 2019-06-09 LAB — AFP, SERUM, OPEN SPINA BIFIDA
AFP MoM: 1.02
AFP Value: 50.4 ng/mL
Gest. Age on Collection Date: 15.9 weeks
Maternal Age At EDD: 25.3 yr
OSBR Risk 1 IN: 10000
Test Results:: NEGATIVE
Weight: 100 [lb_av]

## 2019-06-22 ENCOUNTER — Other Ambulatory Visit: Payer: Self-pay | Admitting: *Deleted

## 2019-06-22 DIAGNOSIS — D563 Thalassemia minor: Secondary | ICD-10-CM

## 2019-06-22 DIAGNOSIS — Z3480 Encounter for supervision of other normal pregnancy, unspecified trimester: Secondary | ICD-10-CM

## 2019-06-22 NOTE — Progress Notes (Signed)
Referral placed for MFM due to abn Horizon screening.

## 2019-06-23 ENCOUNTER — Other Ambulatory Visit: Payer: Self-pay | Admitting: *Deleted

## 2019-06-23 DIAGNOSIS — Z3480 Encounter for supervision of other normal pregnancy, unspecified trimester: Secondary | ICD-10-CM

## 2019-06-23 NOTE — Progress Notes (Signed)
Change in u/s order to Detail due to abn horizon screen.

## 2019-06-25 ENCOUNTER — Encounter: Payer: Self-pay | Admitting: Certified Nurse Midwife

## 2019-06-29 ENCOUNTER — Ambulatory Visit (HOSPITAL_COMMUNITY): Payer: Medicaid Other | Admitting: *Deleted

## 2019-06-29 ENCOUNTER — Ambulatory Visit (HOSPITAL_COMMUNITY)
Admission: RE | Admit: 2019-06-29 | Discharge: 2019-06-29 | Disposition: A | Discharge status: Home / Self Care | Payer: Medicaid Other | Source: Ambulatory Visit | Attending: Obstetrics and Gynecology | Admitting: Obstetrics and Gynecology

## 2019-06-29 ENCOUNTER — Other Ambulatory Visit: Payer: Self-pay

## 2019-06-29 ENCOUNTER — Encounter (HOSPITAL_COMMUNITY): Payer: Self-pay

## 2019-06-29 ENCOUNTER — Ambulatory Visit: Payer: Self-pay | Admitting: Genetic Counselor

## 2019-06-29 ENCOUNTER — Ambulatory Visit (HOSPITAL_BASED_OUTPATIENT_CLINIC_OR_DEPARTMENT_OTHER): Payer: Medicaid Other | Admitting: Genetic Counselor

## 2019-06-29 DIAGNOSIS — O09892 Supervision of other high risk pregnancies, second trimester: Secondary | ICD-10-CM

## 2019-06-29 DIAGNOSIS — Z3A19 19 weeks gestation of pregnancy: Secondary | ICD-10-CM | POA: Diagnosis not present

## 2019-06-29 DIAGNOSIS — O09899 Supervision of other high risk pregnancies, unspecified trimester: Secondary | ICD-10-CM | POA: Insufficient documentation

## 2019-06-29 DIAGNOSIS — D563 Thalassemia minor: Secondary | ICD-10-CM

## 2019-06-29 DIAGNOSIS — Z363 Encounter for antenatal screening for malformations: Secondary | ICD-10-CM | POA: Diagnosis not present

## 2019-06-29 DIAGNOSIS — Z141 Cystic fibrosis carrier: Secondary | ICD-10-CM | POA: Insufficient documentation

## 2019-06-29 DIAGNOSIS — Z148 Genetic carrier of other disease: Secondary | ICD-10-CM | POA: Diagnosis not present

## 2019-06-29 DIAGNOSIS — Z3480 Encounter for supervision of other normal pregnancy, unspecified trimester: Secondary | ICD-10-CM

## 2019-06-29 DIAGNOSIS — Z315 Encounter for genetic counseling: Secondary | ICD-10-CM

## 2019-06-29 NOTE — Progress Notes (Signed)
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 Annette Rodriguez came to her appointment alone due to COVID-19 visitor restrictions.   Indication for genetic counseling - Silent carrier for alpha-thalassemia - Carrier for cystic fibrosis  Prenatal history  Annette Rodriguez is a [id number], 24 y.o. female. Her current pregnancy has completed [redacted]w[redacted]d (Estimated Date of Delivery: 11/23/19).  Annette Rodriguez denied exposure to environmental toxins or chemical agents. She denied the use of alcohol and street drugs. She quit smoking cigarettes on 03/20/19. She reported taking prenatal vitamins. She denied significant viral illnesses, fevers, and bleeding during the course of her pregnancy. Her medical and surgical histories were noncontributory.  Family History  A three generation pedigree was drafted and reviewed. The family history is remarkable for the following:  - Annette Rodriguez has a paternal aunt who has a son with autism. We reviewed that autism can be isolated, multifactorial, or part of a genetic syndrome; however, underlying genetic conditions account for less than 10% of autism diagnoses. Given that Annette Rodriguez's cousin is a fourth-degree relative to her fetus, the risk of recurrence is likely not greatly elevated above the general population risk of ~1 in 39, or 1.5%.  - Annette Rodriguez has a maternal half brother whose son is receiving speech therapy for speech delays. Annette Rodriguez also reported that her nephew is "different". We discussed that many times, developmental delays are multifactorial in nature, occurring due to a combination of genetic and environmental factors that are difficult to identify. Developmental delays can appear to run in families; thus, there is a chance that Annette Rodriguez's children could also experience  developmental delays of some kind. Annette Rodriguez understands that she should make the pediatrician aware of any concerns she has about her children's development.  - Annette Rodriguez's partner, Annette Nap., has a maternal half sister who had two sons die within days of birth. Annette Rodriguez did not have many details about the cause of death for these individuals; however, she reported that she thought that one of the boys had "fluid in his body". We discussed the possibility of these children having a genetic condition, possibly one that is inherited in an X-linked fashion. Given that Annette Rodriguez is not affected by this condition himself, risk of recurrence for the couple's children is likely low. However, without further details, precise risk assessment is limited.  The remaining family histories were reviewed and found to be noncontributory for birth defects, intellectual disability, recurrent pregnancy loss, and known genetic conditions. Annette Rodriguez had limited information about her partner's paternal family history; thus, risk assessment was limited.   The patient's ethnicity is [place] and [place] American. The father of the pregnancy's ethnicity is African American and Caucasian. Ashkenazi Jewish ancestry and consanguinity were denied. Pedigree will be scanned under Media.  Discussion  Annette Rodriguez had Horizon-14 carrier screening performed through [place]. The results of the screen identified her as a silent carrier for alpha-thalassemia (aa/a-). Alpha-thalassemia is different in its inheritance compared to other hemoglobinopathies as there are two copies of two alpha globin genes (HBA1 and HBA2) on each chromosome 16, or four alpha globin genes total (aa/aa). A person can be a carrier of one alpha gene mutation (aa/a-), also referred to as a "silent carrier". A person who carries two alpha globin gene mutations can either carry them in cis (  both on the same chromosome, denoted as aa/--) or in trans (on  different chromosomes, denoted as a-/a-).    There are several different forms of alpha-thalassemia. The most severe form of alpha-thalassemia, Hb Barts, is associated with an absence of alpha globin chain synthesis as a result of deletions of all four alpha globin genes (--/--).  Given that Annette Rodriguez is a silent carrier (aa/a-), her pregnancies would not be at increased risk for Hb Barts, even if her partner is a carrier for alpha-thalassemia, as she will always pass on at least one copy of the alpha globin gene to her children. Hemoglobin H (HbH) disease is caused by three deleted or dysfunctioning alpha globin alleles (a-/--) and is characterized by microcytic hypochromic hemolytic anemia, hepatosplenomegaly, mild jaundice, growth retardation, and sometimes thalassemia-like bone changes. Given Annette Rodriguez's silent carrier status (aa/a-), the current fetus would only be at risk for HbH disease (a-/--), if her partner is a carrier for two alpha globin mutations in cis (aa/--). If this is the case, the risk for HbH disease in the pregnancy would be 1 in 4 (25%). However, if Annette Rodriguez partner is a carrier for two alpha globin mutations, he would be more likely to carry them in trans configuration (a-/a-) than the cis configuration (aa/--), given his ethnicity, as cis configuration is most common in individuals with Asian ancestry. If Annette Rodriguez partner is a carrier of alpha-thalassemia in trans, then the pregnancy would not be at increased risk for HbH disease. Based on the carrier frequency for alpha-thalassemia in the pan-ethnic population, Annette Rodriguez's partner has a 1 in 25 chance of being any type of carrier for alpha-thalassemia.   Annette Rodriguez carrier screening also identified her as a carrier for cystic fibrosis (CF). CF is a condition characterized by the buildup of thick, sticky mucus that can damage the body's organs. Mucus is a slippery substance that lubricates and protects the linings  of the airways, digestive system, reproductive system, and other organs and tissues. Individuals with CF have abnormally sticky mucus that can clog the airways and digestive system, leading to progressive damage to the respiratory system and chronic digestive system problems. The most common features of CF include respiratory difficulties, bacterial infections in the lungs, the formation of scar tissue (fibrosis) and cysts in the lungs, pancreatic insufficiency, CF-related diabetes mellitus, diarrhea, malnutrition, poor growth, and weight loss. Most men with CF have congenital bilateral absence of the vas deferens (CBAVD) which causes female infertility. With therapies, such as daily respiratory therapies and medications to aid digestion, the median lifespan for people with CF is now in their 40's. Treatment may involve lung transplantation and CFTR protein modulators in some cases. CF is variably expressed, meaning features of the condition and their severity vary among affected individuals. Expression and severity of CF depends upon the specific mutations present in an affected individual.   CF is caused by mutations in the CFTR gene. This gene provides instructions for a channel that transports chloride ions into and out of cells. The flow of chloride ions helps control the movement of water in the body's tissues, which is necessary for the production of thin, freely flowing mucus. Pathogenic variants in the CFTR gene disrupt the function of the chloride channels, preventing them from regulating the flow of chloride ions and water across cell membranes.   CF is inherited in an autosomal recessive fashion. This means that the current fetus is only at risk for CF if Ms. Sharrar partner  is also a carrier for the condition. Based on the carrier frequency for CF in the pan-ethnic population, Ms. Mancillas's partner has a 1 in 27 chance of being a carrier for CF. Thus, the couple currently has a 1 in 180 (0.6%) chance  of having a child with CF. If Ms. Fike partner is also identified to be a carrier, the risk for CF in the pregnancy would be 1 in 4 (25%).   Ms. Ellingsen's carrier screening was negative for the other 12 conditions screened. Thus, her risk to be a carrier for these additional conditions (listed separately in the laboratory report) has been reduced but not eliminated. This also significantly reduces her risk of having a child affected by one of these conditions. We discussed that carrier testing for alpha-thalassemia and CF is recommended for Ms. Lipuma's partner. Ms. Winiarski reported that her partner has already submitted a saliva sample for Horizon partner carrier screening.   We also reviewed that Ms. Leedom had Panorama NIPS through the laboratory [facility] that was low-risk for fetal aneuploidies. We reviewed that these results showed a less than 1 in 10,000 risk for trisomies 21, 18 and 13, and monosomy X (Turner syndrome).  In addition, the risk for triploidy and sex chromosome trisomies (47,XXX and 47,XXY) was also low. Ms. Shoe elected to have cfDNA analysis for 22q11.2 deletion syndrome, which was also low risk (1 in 9000). We reviewed that while this testing identifies 94-99% of pregnancies with trisomy 37, trisomy 88, trisomy 12, sex chromosome aneuploidies, and triploidy, it is NOT diagnostic. A positive test result requires confirmation by CVS or amniocentesis, and a negative test result does not rule out a fetal chromosome abnormality. She also understands that this testing does not identify all genetic conditions.  A complete ultrasound was performed today prior to our visit. The ultrasound report will be sent under separate cover. There were no visualized fetal anomalies or markers suggestive of aneuploidy.  Ms. Brege was also counseled regarding diagnostic testing via amniocentesis. We discussed the technical aspects of the procedure and quoted up to a 1 in 500 (0.2%) risk for spontaneous  pregnancy loss or other adverse pregnancy outcomes as a result of amniocentesis. Cultured cells from an amniocentesis sample allow for the visualization of a fetal karyotype, which can detect >99% of chromosomal aberrations. Chromosomal microarray can also be performed to identify smaller deletions or duplications of fetal chromosomal material. Amniocentesis could also be performed to assess whether the baby is affected by alpha-thalassemia or CF. After careful consideration, Ms. Albergo declined amniocentesis at this time. She understands that amniocentesis is available at any point after 16 weeks of pregnancy and that she may opt to undergo the procedure at a later date should she change her mind.  Ms. Frees reported that her partner, Doloris Hall., submitted a saliva sample for partner carrier screening. Results were reportedly still pending at the time of our visit. I provided Ms. Schweppe with my contact information and encouraged her to contact me if her partner's results are positive for any findings. We can discuss the implications of his results from there. Ms. Quintela was agreeable to this plan. I also informed Ms. Helvey that newborn screening performed for all infants in [place] assesses for CF after birth, but that alpha-thalassemia may be harder to identify on this screen.  I counseled Ms. Wahlberg regarding the above risks and available options. The approximate face-to-face time with the genetic counselor was 25 minutes.  In summary:  Discussed  carrier screening results and options for follow-up testing  Silent carrier for alpha-thalassemia  Carrier for cystic fibrosis  Recommend partner carrier screening. Patient's partner has reportedly already submitted a sample to [facility] for testing. I encouraged her to contact me if her partner's results are positive for any findings so we can discuss the implications of the couple's results  Reviewed low-risk NIPS result  Reduction in  risk for Down syndrome, trisomy 40, trisomy 73, triploidy, sex chromosome aneuploidies, and 22q11.2 deletion syndrome  Reviewed results of ultrasound  No fetal anomalies or markers seen  Reduction in risk for fetal aneuploidy  Offered additional testing and screening  Declined amniocentesis  Reviewed family history concerns   Gershon Crane, MS, Aeronautical engineer

## 2019-07-05 ENCOUNTER — Telehealth (INDEPENDENT_AMBULATORY_CARE_PROVIDER_SITE_OTHER): Payer: Self-pay | Admitting: Certified Nurse Midwife

## 2019-07-05 DIAGNOSIS — Z5329 Procedure and treatment not carried out because of patient's decision for other reasons: Secondary | ICD-10-CM

## 2019-07-05 DIAGNOSIS — Z91199 Patient's noncompliance with other medical treatment and regimen due to unspecified reason: Secondary | ICD-10-CM

## 2019-07-06 NOTE — Progress Notes (Signed)
Patient did not answer for mychart appointment x 2 on 5/3

## 2019-07-07 ENCOUNTER — Telehealth: Payer: Self-pay | Admitting: Certified Nurse Midwife

## 2019-08-09 ENCOUNTER — Encounter: Payer: Medicaid Other | Admitting: Obstetrics and Gynecology

## 2019-08-16 ENCOUNTER — Encounter: Payer: Medicaid Other | Admitting: Obstetrics & Gynecology

## 2019-08-25 ENCOUNTER — Encounter: Payer: Medicaid Other | Admitting: Obstetrics & Gynecology

## 2019-10-07 ENCOUNTER — Encounter: Payer: Self-pay | Admitting: Obstetrics and Gynecology

## 2019-10-07 ENCOUNTER — Other Ambulatory Visit: Payer: Self-pay

## 2019-10-07 ENCOUNTER — Ambulatory Visit (INDEPENDENT_AMBULATORY_CARE_PROVIDER_SITE_OTHER): Payer: Medicaid Other | Admitting: Obstetrics and Gynecology

## 2019-10-07 ENCOUNTER — Other Ambulatory Visit (HOSPITAL_COMMUNITY)
Admission: RE | Admit: 2019-10-07 | Discharge: 2019-10-07 | Disposition: A | Discharge status: Home / Self Care | Payer: Medicaid Other | Source: Ambulatory Visit | Attending: Obstetrics and Gynecology | Admitting: Obstetrics and Gynecology

## 2019-10-07 VITALS — BP 112/67 | HR 105 | Wt 112.0 lb

## 2019-10-07 DIAGNOSIS — O0993 Supervision of high risk pregnancy, unspecified, third trimester: Secondary | ICD-10-CM

## 2019-10-07 DIAGNOSIS — O26843 Uterine size-date discrepancy, third trimester: Secondary | ICD-10-CM

## 2019-10-07 DIAGNOSIS — Z23 Encounter for immunization: Secondary | ICD-10-CM

## 2019-10-07 DIAGNOSIS — Z3480 Encounter for supervision of other normal pregnancy, unspecified trimester: Secondary | ICD-10-CM | POA: Insufficient documentation

## 2019-10-07 DIAGNOSIS — Z98891 History of uterine scar from previous surgery: Secondary | ICD-10-CM

## 2019-10-07 NOTE — Progress Notes (Signed)
Pt presents for routine OB visit c/o low abdominal pain, +FM, denies LOB, denies VB

## 2019-10-07 NOTE — Progress Notes (Signed)
   PRENATAL VISIT NOTE  Subjective:  Annette Rodriguez is a 24 y.o. G3P1102 at [redacted]w[redacted]d being seen today for ongoing prenatal care.  She is currently monitored for the following issues for this high-risk pregnancy and has History of placenta abruption; History of C-section; Anemia during pregnancy; Cystic fibrosis carrier; Supervision of other normal pregnancy, antepartum; Cystic fibrosis carrier, antepartum; and Alpha thalassemia silent carrier on their problem list.  Patient reports no complaints.  Contractions: Irritability. Vag. Bleeding: None.  Movement: Present. Denies leaking of fluid.   Has been seen twice during this pregnancy.  Was in [place] for a family emergency. Did not receive prenatal care while in [place].  Having BH contractions for past two weeks. They last for about an hour and then go away.  Hx of preterm delivery at 32 week for placental abruption. C/S.   The following portions of the patient's history were reviewed and updated as appropriate: allergies, current medications, past family history, past medical history, past social history, past surgical history and problem list.   Objective:   Vitals:   10/07/19 1540  BP: 112/67  Pulse: (!) 105  Weight: 112 lb (50.8 kg)  FH: 30   Fetal Status:   Fundal Height: 30 cm Movement: Present     General:  Alert, oriented and cooperative. Patient is in no acute distress.  Skin: Skin is warm and dry. No rash noted.   Cardiovascular: Normal heart rate noted  Respiratory: Normal respiratory effort, no problems with respiration noted  Abdomen: Soft, gravid, appropriate for gestational age.  Pain/Pressure: Present     Pelvic: Cervical exam deferred      External genitalia normal. GBS and GC/C obtained  Extremities: Normal range of motion.  Edema: None  Mental Status: Normal mood and affect. Normal behavior. Normal judgment and thought content.   Assessment and Plan:  Pregnancy: G3P1102 at [redacted]w[redacted]d  1. Supervision of high risk pregnancy,  antepartum - Strep Gp B NAA - Cervicovaginal ancillary only -CBC, RPR, HIV, will obtain a1c as pt unsure when she can come in for 2hr gtt -Tdap given today   2. History of C/S -desires repeat C/S, will schedule  -needs consent signed   3. Size not consistent with dates -growth [place] ordered  Preterm labor symptoms and general obstetric precautions including but not limited to vaginal bleeding, contractions, leaking of fluid and fetal movement were reviewed in detail with the patient. Please refer to After Visit Summary for other counseling recommendations.   Return in about 2 weeks (around 10/21/2019) for OB, MD only face to face.  Future Appointments  Date Time Provider Department Center  10/13/2019  3:30 PM [facility] US[facility]  10/20/2019  4:15 PM Warden Fillers, MD [facility] None    Gita Kudo, MD

## 2019-10-08 LAB — CBC
Hematocrit: 26.7 % — ABNORMAL LOW (ref 34.0–46.6)
Hemoglobin: 8.9 g/dL — ABNORMAL LOW (ref 11.1–15.9)
MCH: 28.7 pg (ref 26.6–33.0)
MCHC: 33.3 g/dL (ref 31.5–35.7)
MCV: 86 fL (ref 79–97)
Platelets: 318 10*3/uL (ref 150–450)
RBC: 3.1 x10E6/uL — ABNORMAL LOW (ref 3.77–5.28)
RDW: 11.8 % (ref 11.7–15.4)
WBC: 12.5 10*3/uL — ABNORMAL HIGH (ref 3.4–10.8)

## 2019-10-08 LAB — HIV ANTIBODY (ROUTINE TESTING W REFLEX): HIV Screen 4th Generation wRfx: NONREACTIVE

## 2019-10-08 LAB — RPR: RPR Ser Ql: NONREACTIVE

## 2019-10-08 LAB — HEMOGLOBIN A1C
Est. average glucose Bld gHb Est-mCnc: 114 mg/dL
Hgb A1c MFr Bld: 5.6 % (ref 4.8–5.6)

## 2019-10-09 LAB — STREP GP B NAA: Strep Gp B NAA: POSITIVE — AB

## 2019-10-10 LAB — CERVICOVAGINAL ANCILLARY ONLY
Bacterial Vaginitis (gardnerella): NEGATIVE
Candida Glabrata: NEGATIVE
Candida Vaginitis: POSITIVE — AB
Chlamydia: NEGATIVE
Comment: NEGATIVE
Comment: NEGATIVE
Comment: NEGATIVE
Comment: NEGATIVE
Comment: NEGATIVE
Comment: NORMAL
Neisseria Gonorrhea: NEGATIVE
Trichomonas: NEGATIVE

## 2019-10-13 ENCOUNTER — Other Ambulatory Visit: Payer: Self-pay

## 2019-10-13 ENCOUNTER — Ambulatory Visit: Payer: Medicaid Other | Admitting: *Deleted

## 2019-10-13 ENCOUNTER — Ambulatory Visit: Payer: Medicaid Other | Attending: Obstetrics and Gynecology

## 2019-10-13 DIAGNOSIS — O09893 Supervision of other high risk pregnancies, third trimester: Secondary | ICD-10-CM | POA: Diagnosis not present

## 2019-10-13 DIAGNOSIS — O0993 Supervision of high risk pregnancy, unspecified, third trimester: Secondary | ICD-10-CM | POA: Insufficient documentation

## 2019-10-13 DIAGNOSIS — Z3A34 34 weeks gestation of pregnancy: Secondary | ICD-10-CM | POA: Diagnosis not present

## 2019-10-13 DIAGNOSIS — D563 Thalassemia minor: Secondary | ICD-10-CM

## 2019-10-13 DIAGNOSIS — Z362 Encounter for other antenatal screening follow-up: Secondary | ICD-10-CM

## 2019-10-13 DIAGNOSIS — O26843 Uterine size-date discrepancy, third trimester: Secondary | ICD-10-CM | POA: Diagnosis not present

## 2019-10-13 DIAGNOSIS — Z141 Cystic fibrosis carrier: Secondary | ICD-10-CM | POA: Insufficient documentation

## 2019-10-13 DIAGNOSIS — Z148 Genetic carrier of other disease: Secondary | ICD-10-CM

## 2019-10-13 DIAGNOSIS — O09899 Supervision of other high risk pregnancies, unspecified trimester: Secondary | ICD-10-CM | POA: Diagnosis not present

## 2019-10-17 ENCOUNTER — Inpatient Hospital Stay (HOSPITAL_COMMUNITY)
Admission: AD | Admit: 2019-10-17 | Discharge: 2019-10-18 | DRG: 833 | Disposition: A | Discharge status: Home / Self Care | Payer: Medicaid Other | Attending: Obstetrics and Gynecology | Admitting: Obstetrics and Gynecology

## 2019-10-17 ENCOUNTER — Inpatient Hospital Stay (HOSPITAL_BASED_OUTPATIENT_CLINIC_OR_DEPARTMENT_OTHER): Payer: Medicaid Other

## 2019-10-17 ENCOUNTER — Other Ambulatory Visit: Payer: Self-pay

## 2019-10-17 DIAGNOSIS — O479 False labor, unspecified: Secondary | ICD-10-CM | POA: Diagnosis present

## 2019-10-17 DIAGNOSIS — R109 Unspecified abdominal pain: Secondary | ICD-10-CM | POA: Diagnosis not present

## 2019-10-17 DIAGNOSIS — Z141 Cystic fibrosis carrier: Secondary | ICD-10-CM | POA: Diagnosis not present

## 2019-10-17 DIAGNOSIS — O4700 False labor before 37 completed weeks of gestation, unspecified trimester: Secondary | ICD-10-CM | POA: Diagnosis present

## 2019-10-17 DIAGNOSIS — Z3A34 34 weeks gestation of pregnancy: Secondary | ICD-10-CM

## 2019-10-17 DIAGNOSIS — Z20822 Contact with and (suspected) exposure to covid-19: Secondary | ICD-10-CM | POA: Diagnosis not present

## 2019-10-17 DIAGNOSIS — O99891 Other specified diseases and conditions complicating pregnancy: Secondary | ICD-10-CM | POA: Diagnosis not present

## 2019-10-17 DIAGNOSIS — O4703 False labor before 37 completed weeks of gestation, third trimester: Secondary | ICD-10-CM | POA: Diagnosis not present

## 2019-10-17 DIAGNOSIS — O34219 Maternal care for unspecified type scar from previous cesarean delivery: Secondary | ICD-10-CM | POA: Diagnosis not present

## 2019-10-17 LAB — CBC
HCT: 27.9 % — ABNORMAL LOW (ref 36.0–46.0)
Hemoglobin: 9.2 g/dL — ABNORMAL LOW (ref 12.0–15.0)
MCH: 28.5 pg (ref 26.0–34.0)
MCHC: 33 g/dL (ref 30.0–36.0)
MCV: 86.4 fL (ref 80.0–100.0)
Platelets: 302 10*3/uL (ref 150–400)
RBC: 3.23 MIL/uL — ABNORMAL LOW (ref 3.87–5.11)
RDW: 12.4 % (ref 11.5–15.5)
WBC: 9.8 10*3/uL (ref 4.0–10.5)
nRBC: 0 % (ref 0.0–0.2)

## 2019-10-17 LAB — TYPE AND SCREEN
ABO/RH(D): O POS
Antibody Screen: NEGATIVE

## 2019-10-17 MED ORDER — TERBUTALINE SULFATE 1 MG/ML IJ SOLN
0.2500 mg | Freq: Once | INTRAMUSCULAR | Status: DC
  Filled 2019-10-17: qty 1

## 2019-10-17 MED ORDER — NIFEDIPINE 10 MG PO CAPS
10.0000 mg | ORAL_CAPSULE | ORAL | Status: AC | PRN
  Administered 2019-10-17 (×3): 10 mg via ORAL
  Filled 2019-10-17 (×3): qty 1

## 2019-10-17 MED ORDER — TERBUTALINE SULFATE 1 MG/ML IJ SOLN
0.2500 mg | Freq: Once | INTRAMUSCULAR | Status: AC
  Administered 2019-10-17: 0.25 mg via SUBCUTANEOUS
  Filled 2019-10-17: qty 1

## 2019-10-17 MED ORDER — BETAMETHASONE SOD PHOS & ACET 6 (3-3) MG/ML IJ SUSP
12.0000 mg | Freq: Once | INTRAMUSCULAR | Status: AC
  Administered 2019-10-17: 12 mg via INTRAMUSCULAR
  Filled 2019-10-17: qty 5

## 2019-10-17 MED ORDER — FAMOTIDINE IN NACL 20-0.9 MG/50ML-% IV SOLN
20.0000 mg | Freq: Once | INTRAVENOUS | Status: AC
  Administered 2019-10-18: 20 mg via INTRAVENOUS
  Filled 2019-10-17: qty 50

## 2019-10-17 MED ORDER — LACTATED RINGERS IV BOLUS
1000.0000 mL | Freq: Once | INTRAVENOUS | Status: AC
  Administered 2019-10-17: 1000 mL via INTRAVENOUS

## 2019-10-17 MED ORDER — LACTATED RINGERS IV SOLN: INTRAVENOUS | Status: DC

## 2019-10-17 MED ORDER — ONDANSETRON HCL 4 MG/2ML IJ SOLN: 4.0000 mg | Freq: Four times a day (QID) | INTRAMUSCULAR | Status: DC | PRN

## 2019-10-17 NOTE — MAU Note (Signed)
Annette Rodriguez is a 24 y.o. at [redacted]w[redacted]d here in MAU reporting:  +abdominal pain that radiates to her back Endorses braxton hicks for the past week but pain got worse suddenly today Pain score: 9/10 Vitals:   10/17/19 1905  BP: 114/62  Pulse: 89  Resp: 16  Temp: 98.1 F (36.7 C)  SpO2: 100%     FHT:+FM Lab orders placed from triage: none  Patient brought back from lobby by Schuyler Amor

## 2019-10-17 NOTE — MAU Provider Note (Addendum)
Patient Annette Rodriguez is a 24 y.o. G3P1102  at 62w5dhere with complaints of abdominal pain and contractions. She denies vaginal bleeding, decreased fetal movements, HA, nausea, vomiting, LOF. No recent IC.   She has a history of c/section x 2. First c/section was in 2017 for "placental abruption" at 34 weeks. Her first pregnancy was complicated by short cervix and emergency c/section at 34 weeks.  With her second c/section (with Faculty), she came into MAU with contractions and desired repeat c/section at 37.4. See notes from April 2019.   [facility], patient was clutching her belly and crying in pain.  History     CSN: [id number] Arrival date and time: 10/17/19 1839  Chief Complaint  Patient presents with  . Abdominal Pain   Abdominal Pain This is a new problem. The current episode started today. The problem occurs constantly. The pain is located in the RLQ and RUQ. The pain is at a severity of 9/10. The quality of the pain is cramping. The pain is aggravated by movement. The pain is relieved by nothing.    OB History    Gravida  3   Para  2   Term  1   Preterm  1   AB      Living  2     SAB      TAB      Ectopic      Multiple      Live Births  2           Past Medical History:  Diagnosis Date  . Anemia   . Anemia affecting pregnancy in third trimester 04/29/2017   H 9.6 04/29/17  . Cystic fibrosis carrier   . Headache   . Insufficient prenatal care 04/23/2017   NOB visit at 31 weeks per patient.  S<D.    . Post-operative state 06/07/2017  . Supervision of high risk pregnancy, antepartum 04/23/2017    Clinic  [facility]  Prenatal Labs Dating LMP  Blood type: O/Positive/-- (02/20 1347)  Genetic Screen  AFP:          NIPS:Panorama: Neg Antibody:Negative (02/20 1347) Anatomic [place] Rubella: 3.02 (02/20 1347) GTT   Third trimester: WNL RPR: Non Reactive (02/26 1108)  Flu vaccine Declined 04/23/17 HBsAg: Negative (02/20 1347)   TDaP vaccine 04/23/17                                  Rhogam:n/a O+ HIV: Non Re  . Vaginal Pap smear, abnormal   . Vitamin D deficiency 04/25/2017    Past Surgical History:  Procedure Laterality Date  . CESAREAN SECTION  05/11/2015  . CESAREAN SECTION N/A 06/07/2017   Procedure: CESAREAN SECTION;  Surgeon: EChancy Milroy MD;  [place]  Service: Obstetrics;  Laterality: N/A;    Family History  Problem Relation Age of Onset  . Asthma Maternal Grandmother     Social History   Tobacco Use  . Smoking status: Never Smoker  . Smokeless tobacco: Never Used  Vaping Use  . Vaping Use: Never used  Substance Use Topics  . Alcohol use: No  . Drug use: No    Comment: Stopped +UPT     Allergies: Not on File  Medications Prior to Admission  Medication Sig Dispense Refill Last Dose  . aspirin 81 MG chewable tablet Chew 1 tablet (  81 mg total) by mouth daily. (Patient not taking: Reported on 06/29/2019) 30 tablet 6   . Blood Pressure Monitoring (BLOOD PRESSURE KIT) DEVI 1 kit by Does not apply route once a week. Check Blood Pressure regularly and record readings into the Babyscripts App.  Large Cuff.  DX O90.0 (Patient not taking: Reported on 10/07/2019) 1 each 0   . Doxylamine-Pyridoxine (DICLEGIS) 10-10 MG TBEC 1 tab in AM, 1 tab mid afternoon 2 tabs at bedtime. Max dose 4 tabs daily. (Patient not taking: Reported on 10/07/2019) 100 tablet 5   . ferrous gluconate (FERGON) 324 MG tablet Take 1 tablet (324 mg total) by mouth daily with breakfast. (Patient not taking: Reported on 06/07/2019) 30 tablet 3   . Prenatal Vit-Fe Phos-FA-Omega (VITAFOL GUMMIES) 3.33-0.333-34.8 MG CHEW Chew 3 tablets by mouth daily before breakfast. 90 tablet 11   . vitamin B-6 (PYRIDOXINE) 25 MG tablet Take 1 tablet (25 mg total) by mouth daily. 30 tablet 0     Review of Systems  Constitutional: Negative.   HENT: Negative.   Respiratory: Negative.   Cardiovascular: Negative.   Gastrointestinal:  Positive for abdominal pain.  Genitourinary: Negative.   Musculoskeletal: Negative.   Neurological: Negative.    Physical Exam   Blood pressure 114/62, pulse 89, temperature 98.1 F (36.7 C), temperature source Oral, resp. rate 16, last menstrual period 02/16/2019, SpO2 100 %, currently breastfeeding.  Physical Exam Constitutional:      Appearance: She is well-developed.  Abdominal:     General: Abdomen is flat.     Palpations: Abdomen is soft.     Tenderness: There is abdominal tenderness.     Comments: Abdomen is tender over upper and right quadrant; however is not rigid. Softer between contractions.  Genitourinary:    Vagina: Normal.     Cervix: Normal.     Uterus: Normal.   Neurological:     Mental Status: She is alert.     MAU Course  Procedures  MDM -bedside US show no signs of abruption -IV unable to be started by RNs in MAU, will call IV team. In the meantime, will have phlebotomy draw labs -Patient to be kept NPO for now -Patient declines terbutaline stating that "It never works; if she's coming she's coming".  -will give procardia 10 mg x 3 doses -NST: 130, present acel, mod var, present variables, irregular contractions.  -by Leopolds, infant palpates transverse, which may be part of patient's discomfort.   2049: Labs have been drawn, patient's third dose of procardia and BMZ are pending, IV team at the bedside. NST continues to be reactive with acels, uterine irratability. Cervix is still long, closed, posterior.   Care assumed from K. Kooistra at 2130.   Patient reports no improvement from procardia and IV fluids. Tearful and contracting more frequently and patient reports more painfully. Cervix reexamined and now 1cm/50/0.  Report given to Dr. Ilda Basset regarding cervical change, painful contractions and 2 previous c/s. Dr. Ilda Basset to unit to see patient. Patient now agreeable to terbutaline injection.   Will admit patient to [facility] for observation and  reexamine cervix after 1-2 hours.   Assessment and Plan  -Preterm contractions -Hx of cesarean section   -Admit to [place] turned over to MD. MD to place admission orders.  Wende Mott, CNM 10/17/19 11:03 PM

## 2019-10-17 NOTE — H&P (Signed)
Patient Annette Rodriguez is a 24 y.o. G3P1102  at [redacted]w[redacted]d here with complaints of abdominal pain and contractions. She denies vaginal bleeding, decreased fetal movements, HA, nausea, vomiting, LOF. No recent IC.   She has a history of c/section x 2. First c/section was in 2017 for "placental abruption" at 34 weeks. Her first pregnancy was complicated by short cervix and emergency c/section at 34 weeks.  With her second c/section (with Faculty), she came into MAU with contractions and desired repeat c/section at 37.4. See notes from April 2019.   [facility]. Upon transfer to MAU, patient was clutching her belly and crying in pain.  History     CSN: [id number]  Arrival date and time: 10/17/19 1839  Chief Complaint  Patient presents with  . Abdominal Pain   Abdominal Pain This is a new problem. The current episode started today. The problem occurs constantly. The pain is located in the RLQ and RUQ. The pain is at a severity of 9/10. The quality of the pain is cramping. The pain is aggravated by movement. The pain is relieved by nothing.    OB History    Gravida  3   Para  2   Term  1   Preterm  1   AB      Living  2     SAB      TAB      Ectopic      Multiple      Live Births  2           Past Medical History:  Diagnosis Date  . Anemia   . Anemia affecting pregnancy in third trimester 04/29/2017   H 9.6 04/29/17  . Cystic fibrosis carrier   . Headache   . Insufficient prenatal care 04/23/2017   NOB visit at 31 weeks per patient.  S<D.    . Post-operative state 06/07/2017  . Supervision of high risk pregnancy, antepartum 04/23/2017    Clinic  [facility]  Prenatal Labs Dating LMP  Blood type: O/Positive/-- (02/20 1347)  Genetic Screen  AFP:          NIPS:Panorama: Neg Antibody:Negative (02/20 1347) Anatomic US  Rubella: 3.02 (02/20 1347) GTT   Third trimester: WNL RPR: Non Reactive (02/26 1108)  Flu vaccine Declined 04/23/17 HBsAg: Negative (02/20 1347)   TDaP vaccine 04/23/17                                  Rhogam:n/a O+ HIV: Non Re  . Vaginal Pap smear, abnormal   . Vitamin D deficiency 04/25/2017    Past Surgical History:  Procedure Laterality Date  . CESAREAN SECTION  05/11/2015  . CESAREAN SECTION N/A 06/07/2017   Procedure: CESAREAN SECTION;  Surgeon: Annette Rodriguez, Annette Rodriguez;  [facility];  Service: Obstetrics;  Laterality: N/A;    Family History  Problem Relation Age of Onset  . Asthma Maternal Grandmother     Social History   Tobacco Use  . Smoking status: Never Smoker  . Smokeless tobacco: Never Used  Vaping Use  . Vaping Use: Never used  Substance Use Topics  . Alcohol use: No  . Drug use: No    Comment: Stopped +UPT     Allergies: Not on File  Medications Prior to Admission  Medication Sig Dispense Refill Last Dose  . aspirin 81 MG chewable tablet Chew 1 tablet (  81 mg total) by mouth daily. (Patient not taking: Reported on 06/29/2019) 30 tablet 6   . Blood Pressure Monitoring (BLOOD PRESSURE KIT) DEVI 1 kit by Does not apply route once a week. Check Blood Pressure regularly and record readings into the Babyscripts App.  Large Cuff.  DX O90.0 (Patient not taking: Reported on 10/07/2019) 1 each 0   . Doxylamine-Pyridoxine (DICLEGIS) 10-10 MG TBEC 1 tab in AM, 1 tab mid afternoon 2 tabs at bedtime. Max dose 4 tabs daily. (Patient not taking: Reported on 10/07/2019) 100 tablet 5   . ferrous gluconate (FERGON) 324 MG tablet Take 1 tablet (324 mg total) by mouth daily with breakfast. (Patient not taking: Reported on 06/07/2019) 30 tablet 3   . Prenatal Vit-Fe Phos-FA-Omega (VITAFOL GUMMIES) 3.33-0.333-34.8 MG CHEW Chew 3 tablets by mouth daily before breakfast. 90 tablet 11   . vitamin B-6 (PYRIDOXINE) 25 MG tablet Take 1 tablet (25 mg total) by mouth daily. 30 tablet 0     Review of Systems  Constitutional: Negative.   HENT: Negative.   Respiratory: Negative.   Cardiovascular: Negative.   Gastrointestinal:  Positive for abdominal pain.  Genitourinary: Negative.   Musculoskeletal: Negative.   Neurological: Negative.    Physical Exam   Blood pressure 114/62, pulse 89, temperature 98.1 F (36.7 C), temperature source Oral, resp. rate 16, last menstrual period 02/16/2019, SpO2 100 %, currently breastfeeding.  Physical Exam Constitutional:      Appearance: She is well-developed.  Abdominal:     General: Abdomen is flat.     Palpations: Abdomen is soft.     Tenderness: There is abdominal tenderness.     Comments: Abdomen is tender over upper and right quadrant; however is not rigid. Softer between contractions.  Genitourinary:    Vagina: Normal.     Cervix: Normal.     Uterus: Normal.   Neurological:     Mental Status: She is alert.     MAU Course  Procedures  MDM -bedside US show no signs of abruption -IV unable to be started by RNs in MAU, will call IV team. In the meantime, will have phlebotomy draw labs -Patient to be kept NPO for now -Patient declines terbutaline stating that "It never works; if she's coming she's coming".  -will give procardia 10 mg x 3 doses -NST: 130, present acel, mod var, present variables, irregular contractions.  -by Leopolds, infant palpates transverse, which may be part of patient's discomfort.   2049: Labs have been drawn, patient's third dose of procardia and BMZ are pending, IV team at the bedside. NST continues to be reactive with acels, uterine irratability. Cervix is still long, closed, posterior.   Care assumed from Annette Rodriguez at 2130.   Patient reports no improvement from procardia and IV fluids. Tearful and contracting more frequently and patient reports more painfully. Cervix reexamined and now 1cm/50/0.  Report given to Dr. Pickens regarding cervical change, painful contractions and 2 previous c/s. Dr. Pickens to unit to see patient. Patient now agreeable to terbutaline injection.   Will admit patient to [facility] for observation and  reexamine cervix after 1-2 hours.   Assessment and Plan  -Preterm contractions -Hx of cesarean section   -Admit to [facility] -Care turned over to Rodriguez. Rodriguez to place admission orders.  Annette Rodriguez, CNM 10/17/19 11:03 PM   

## 2019-10-18 ENCOUNTER — Other Ambulatory Visit: Payer: Self-pay

## 2019-10-18 ENCOUNTER — Inpatient Hospital Stay (HOSPITAL_COMMUNITY)
Admission: AD | Admit: 2019-10-18 | Discharge: 2019-10-18 | Disposition: A | Discharge status: Home / Self Care | Payer: Medicaid Other | Attending: Obstetrics & Gynecology | Admitting: Obstetrics & Gynecology

## 2019-10-18 ENCOUNTER — Encounter (HOSPITAL_COMMUNITY): Payer: Self-pay | Admitting: Obstetrics and Gynecology

## 2019-10-18 DIAGNOSIS — Z3A34 34 weeks gestation of pregnancy: Secondary | ICD-10-CM | POA: Insufficient documentation

## 2019-10-18 DIAGNOSIS — O4700 False labor before 37 completed weeks of gestation, unspecified trimester: Secondary | ICD-10-CM

## 2019-10-18 DIAGNOSIS — O479 False labor, unspecified: Secondary | ICD-10-CM

## 2019-10-18 DIAGNOSIS — O4703 False labor before 37 completed weeks of gestation, third trimester: Secondary | ICD-10-CM | POA: Diagnosis not present

## 2019-10-18 LAB — SARS CORONAVIRUS 2 BY RT PCR (HOSPITAL ORDER, PERFORMED IN ~~LOC~~ HOSPITAL LAB): SARS Coronavirus 2: NEGATIVE

## 2019-10-18 LAB — RPR: RPR Ser Ql: NONREACTIVE

## 2019-10-18 MED ORDER — ZOLPIDEM TARTRATE 5 MG PO TABS
5.0000 mg | ORAL_TABLET | Freq: Once | ORAL | Status: AC
  Administered 2019-10-18: 5 mg via ORAL
  Filled 2019-10-18: qty 1

## 2019-10-18 MED ORDER — BETAMETHASONE SOD PHOS & ACET 6 (3-3) MG/ML IJ SUSP
12.0000 mg | Freq: Once | INTRAMUSCULAR | Status: AC
  Administered 2019-10-18: 12 mg via INTRAMUSCULAR
  Filled 2019-10-18: qty 5

## 2019-10-18 NOTE — Progress Notes (Signed)
Pt given discharge instructions. All questions answered. IV discontinued.  

## 2019-10-18 NOTE — Discharge Summary (Signed)
Patient ID: Annette Rodriguez MRN: [id number] DOB/AGE: Jul 21, 1995 24 y.o.  Admit date: 10/17/2019 Discharge date: 10/18/2019  Admission Diagnoses: IUP 34 6/7 weeks, Preterm uterine contractions, Prior c section  Discharge Diagnoses: SAA with resolution of PTL  Prenatal Procedures: BMZ  Consults: NA  Hospital Course:  This is a 24 y.o. [id number] with IUP at 59w6dadmitted for preterm uterine contraction. She was admitted with contractions, noted to have a cervical exam of 1/50.  No leaking of fluid and no bleeding.  She was given oral Procardia and SQ terbutaline with resolution of ut ctx.  She was observed overnight, fetal heart rate monitoring remained reassuring, and she had no signs/symptoms of progressing preterm labor or other maternal-fetal concerns.  Her cervical exam was unchanged from admission.  She was deemed stable for discharge to home with outpatient follow up. Pt requested to return tonight for her second dose of celestone due to childcare issues. Discharge instructions, medications and follow up were reviewed with the pt. She verbalized understanding.   Discharge Exam: Temp:  [98.1 F (36.7 C)-98.5 F (36.9 C)] 98.3 F (36.8 C) (08/16 0802) Pulse Rate:  [68-108] 84 (08/16 0802) Resp:  [16-20] 18 (08/16 0802) BP: (95-121)/(34-69) 116/69 (08/16 0802) SpO2:  [99 %-100 %] 99 % (08/16 0802) Weight:  [48 kg] 48 kg (08/16 0008)   Physical Examination: CONSTITUTIONAL: Well-developed, well-nourished female in no acute distress.  HENT:  Normocephalic, atraumatic, External right and left ear normal. Oropharynx is clear and moist EYES: Conjunctivae and EOM are normal. Pupils are equal, round, and reactive to light. No scleral icterus.  NECK: Normal range of motion, supple, no masses SKIN: Skin is warm and dry. No rash noted. Not diaphoretic. No erythema. No pallor. [place] Alert and oriented to person, place, and time. Normal reflexes, muscle tone coordination. No cranial nerve  deficit noted. PSYCHIATRIC: Normal mood and affect. Normal behavior. Normal judgment and thought content. CARDIOVASCULAR: Normal heart rate noted, regular rhythm RESPIRATORY: Effort and breath sounds normal, no problems with respiration noted MUSCULOSKELETAL: Normal range of motion. No edema and no tenderness. 2+ distal pulses. ABDOMEN: Soft, nontender, nondistended, gravid. CERVIX: Dilation: 1 Effacement (%): 50 Cervical Position: Middle Presentation: Vertex Exam by:: Dr. ERip Harbour Fetal monitoring: FHR: 140 bpm, Variability: moderate, Accelerations: Present, Decelerations: Absent  Uterine activity: no contractions per hour  Significant Diagnostic Studies:  Results for orders placed or performed during the hospital encounter of 10/17/19 (from the past 168 hour(s))  CBC   Collection Time: 10/17/19  8:15 PM  Result Value Ref Range   WBC 9.8 4.0 - 10.5 K/uL   RBC 3.23 (L) 3.87 - 5.11 MIL/uL   Hemoglobin 9.2 (L) 12.0 - 15.0 g/dL   HCT 27.9 (L) 36 - 46 %   MCV 86.4 80.0 - 100.0 fL   MCH 28.5 26.0 - 34.0 pg   MCHC 33.0 30.0 - 36.0 g/dL   RDW 12.4 11.5 - 15.5 %   Platelets 302 150 - 400 K/uL   nRBC 0.0 0.0 - 0.2 %  Type and screen [place]  Collection Time: 10/17/19  8:15 PM  Result Value Ref Range   ABO/RH(D) O POS    Antibody Screen NEG    Sample Expiration      10/20/2019,2359 Performed at [facility] [address] [place] [address]  SARS Coronavirus 2 by RT PCR (hospital order, [facility]) Nasopharyngeal Nasopharyngeal Swab   Collection Time: 10/17/19 11:08 PM  Specimen: Nasopharyngeal Swab  Result Value Ref Range   SARS Coronavirus 2 NEGATIVE NEGATIVE    Discharge Condition: Stable  Disposition: Discharge disposition: 01-Home or Self Care        Discharge Instructions    Discharge activity:  No Restrictions   Complete by: As directed    Discharge diet:  No restrictions   Complete by: As  directed    Do not have sex or do anything that might make you have an orgasm   Complete by: As directed    Fetal Kick Count:  Lie on our left side for one hour after a meal, and count the number of times your baby kicks.  If it is less than 5 times, get up, move around and drink some juice.  Repeat the test 30 minutes later.  If it is still less than 5 kicks in an hour, notify your doctor.   Complete by: As directed    Notify physician for a general feeling that "something is not right"   Complete by: As directed    Notify physician for increase or change in vaginal discharge   Complete by: As directed    Notify physician for intestinal cramps, with or without diarrhea, sometimes described as "gas pain"   Complete by: As directed    Notify physician for leaking of fluid   Complete by: As directed    Notify physician for low, dull backache, unrelieved by heat or Tylenol   Complete by: As directed    Notify physician for menstrual like cramps   Complete by: As directed    Notify physician for pelvic pressure   Complete by: As directed    Notify physician for uterine contractions.  These may be painless and feel like the uterus is tightening or the baby is  "balling up"   Complete by: As directed    Notify physician for vaginal bleeding   Complete by: As directed    PRETERM LABOR:  Includes any of the follwing symptoms that occur between 20 - [redacted] weeks gestation.  If these symptoms are not stopped, preterm labor can result in preterm delivery, placing your baby at risk   Complete by: As directed      Allergies as of 10/18/2019   No Known Allergies     Medication List    STOP taking these medications   aspirin 81 MG chewable tablet   Blood Pressure Kit Devi   Doxylamine-Pyridoxine 10-10 MG Tbec Commonly known as: Diclegis   ferrous gluconate 324 MG tablet Commonly known as: FERGON   vitamin B-6 25 MG tablet Commonly known as: pyridOXINE     TAKE these medications    Vitafol Gummies 3.33-0.333-34.8 MG Chew Chew 3 tablets by mouth daily before breakfast.       Follow-up Information    [facility] Follow up.   Why: Pt has follow up appt this Wednesday Pt to return to MAU tonight 10/18/19 between 8 - 9 PM for second dose of celestone Contact information: [address] [place] [address] [phone]              Signed: Chancy Milroy M.D. 10/18/2019, 10:20 AM

## 2019-10-18 NOTE — MAU Provider Note (Signed)
Annette Rodriguez is a 24 y.o. [phone] at [redacted]w[redacted]d who presents to MAU today for her second BMZ injection. She denies vaginal bleeding, LOF or regular contractions. She reports normal fetal movement.   Medical screening exam complete  Patient has BMZ ordered for administration today   Vonzella Nipple, PA-C 10/18/2019 8:49 PM

## 2019-10-18 NOTE — Progress Notes (Signed)
Antepartum Note  10/18/2019 - 1:22 AM  24 y.o. [id number] [redacted]w[redacted]d. Pregnancy complicated by h/o c-section x 2  Patient Active Problem List   Diagnosis Date Noted  . Preterm contractions 10/17/2019  . Cystic fibrosis carrier, antepartum 06/09/2019  . Alpha thalassemia silent carrier 06/09/2019  . Supervision of other normal pregnancy, antepartum 04/06/2019  . Cystic fibrosis carrier 05/07/2017  . Anemia during pregnancy 04/29/2017  . History of placenta abruption 04/23/2017  . History of C-section 04/23/2017    [name] is admitted for preterm contractions   Subjective:  Patient resting. UCs feel less intense  Objective:    Current Vital Signs 24h Vital Sign Ranges  T 98.5 F (36.9 C) Temp  Avg: 98.3 F (36.8 C)  Min: 98.1 F (36.7 C)  Max: 98.5 F (36.9 C)  BP (!) 98/50 BP  Min: 98/50  Max: 121/62  HR (!) 108 Pulse  Avg: 86.8  Min: 68  Max: 108  RR 20 Resp  Avg: 18  Min: 16  Max: 20  SaO2 100 % Room Air SpO2  Avg: 100 %  Min: 100 %  Max: 100 %       24 Hour I/O Current Shift I/O  Time Ins Outs No intake/output data recorded. No intake/output data recorded.   FHR: 135 baseline, +accels, no decels, mod variability, +scalp stim Toco: irregular q2-60m Gen: NAD SVE: unchanged at 1/50/high  Labs:  Recent Labs  Lab 10/17/19 2015  WBC 9.8  HGB 9.2*  HCT 27.9*  PLT 302   O POS  COVID: neg  Medications Current Facility-Administered Medications  Medication Dose Route Frequency Provider Last Rate Last Admin  . lactated ringers infusion   Intravenous Continuous [place] Bing, MD 125 mL/hr at 10/17/19 2345 New Bag at 10/17/19 2345  . ondansetron (ZOFRAN) injection 4 mg  4 mg Intravenous Q6H PRN [place] Bing, MD        Assessment & Plan:  Pt stable *IUP: category I with accels. Fetal status reassuring *?PTL: cervix exam stable. Continue NPO, continuous EFM, and exp management. Ambien for sleep. BMZ #2 due at 2045 today. Reassess later this morning to see  if can come off continuous EFM *Analgesia: no current needs *Dispo: inpatient for now  Annette Copa. MD Attending Center for [facility])

## 2019-10-18 NOTE — Discharge Instructions (Signed)

## 2019-10-18 NOTE — Discharge Instructions (Signed)
Braxton Hicks Contractions Contractions of the uterus can occur throughout pregnancy, but they are not always a sign that you are in labor. You may have practice contractions called Braxton Hicks contractions. These false labor contractions are sometimes confused with true labor. What are Braxton Hicks contractions? Braxton Hicks contractions are tightening movements that occur in the muscles of the uterus before labor. Unlike true labor contractions, these contractions do not result in opening (dilation) and thinning of the cervix. Toward the end of pregnancy (32-34 weeks), Braxton Hicks contractions can happen more often and may become stronger. These contractions are sometimes difficult to tell apart from true labor because they can be very uncomfortable. You should not feel embarrassed if you go to the hospital with false labor. Sometimes, the only way to tell if you are in true labor is for your health care provider to look for changes in the cervix. The health care provider will do a physical exam and may monitor your contractions. If you are not in true labor, the exam should show that your cervix is not dilating and your water has not broken. If there are no other health problems associated with your pregnancy, it is completely safe for you to be sent home with false labor. You may continue to have Braxton Hicks contractions until you go into true labor. How to tell the difference between true labor and false labor True labor  Contractions last 30-70 seconds.  Contractions become very regular.  Discomfort is usually felt in the top of the uterus, and it spreads to the lower abdomen and low back.  Contractions do not go away with walking.  Contractions usually become more intense and increase in frequency.  The cervix dilates and gets thinner. False labor  Contractions are usually shorter and not as strong as true labor contractions.  Contractions are usually irregular.  Contractions  are often felt in the front of the lower abdomen and in the groin.  Contractions may go away when you walk around or change positions while lying down.  Contractions get weaker and are shorter-lasting as time goes on.  The cervix usually does not dilate or become thin. Follow these instructions at home:   Take over-the-counter and prescription medicines only as told by your health care provider.  Keep up with your usual exercises and follow other instructions from your health care provider.  Eat and drink lightly if you think you are going into labor.  If Braxton Hicks contractions are making you uncomfortable: ? Change your position from lying down or resting to walking, or change from walking to resting. ? Sit and rest in a tub of warm water. ? Drink enough fluid to keep your urine pale yellow. Dehydration may cause these contractions. ? Do slow and deep breathing several times an hour.  Keep all follow-up prenatal visits as told by your health care provider. This is important. Contact a health care provider if:  You have a fever.  You have continuous pain in your abdomen. Get help right away if:  Your contractions become stronger, more regular, and closer together.  You have fluid leaking or gushing from your vagina.  You pass blood-tinged mucus (bloody show).  You have bleeding from your vagina.  You have low back pain that you never had before.  You feel your baby's head pushing down and causing pelvic pressure.  Your baby is not moving inside you as much as it used to. Summary  Contractions that occur before labor are   called Braxton Hicks contractions, false labor, or practice contractions.  Braxton Hicks contractions are usually shorter, weaker, farther apart, and less regular than true labor contractions. True labor contractions usually become progressively stronger and regular, and they become more frequent.  Manage discomfort from Braxton Hicks contractions  by changing position, resting in a warm bath, drinking plenty of water, or practicing deep breathing. This information is not intended to replace advice given to you by your health care provider. Make sure you discuss any questions you have with your health care provider. Document Revised: 01/31/2017 Document Reviewed: 07/04/2016 Elsevier Patient Education  2020 [employer]. Fetal Movement Counts Patient Name: ________________________________________________ Patient Due Date: ____________________ What is a fetal movement count?  A fetal movement count is the number of times that you feel your baby move during a certain amount of time. This may also be called a fetal kick count. A fetal movement count is recommended for every pregnant woman. You may be asked to start counting fetal movements as early as week 28 of your pregnancy. Pay attention to when your baby is most active. You may notice your baby's sleep and wake cycles. You may also notice things that make your baby move more. You should do a fetal movement count:  When your baby is normally most active.  At the same time each day. A good time to count movements is while you are resting, after having something to eat and drink. How do I count fetal movements? 1. Find a quiet, comfortable area. Sit, or lie down on your side. 2. Write down the date, the start time and stop time, and the number of movements that you felt between those two times. Take this information with you to your health care visits. 3. Write down your start time when you feel the first movement. 4. Count kicks, flutters, swishes, rolls, and jabs. You should feel at least 10 movements. 5. You may stop counting after you have felt 10 movements, or if you have been counting for 2 hours. Write down the stop time. 6. If you do not feel 10 movements in 2 hours, contact your health care provider for further instructions. Your health care provider may want to do additional tests  to assess your baby's well-being. Contact a health care provider if:  You feel fewer than 10 movements in 2 hours.  Your baby is not moving like he or she usually does. Date: ____________ Start time: ____________ Stop time: ____________ Movements: ____________ Date: ____________ Start time: ____________ Stop time: ____________ Movements: ____________ Date: ____________ Start time: ____________ Stop time: ____________ Movements: ____________ Date: ____________ Start time: ____________ Stop time: ____________ Movements: ____________ Date: ____________ Start time: ____________ Stop time: ____________ Movements: ____________ Date: ____________ Start time: ____________ Stop time: ____________ Movements: ____________ Date: ____________ Start time: ____________ Stop time: ____________ Movements: ____________ Date: ____________ Start time: ____________ Stop time: ____________ Movements: ____________ Date: ____________ Start time: ____________ Stop time: ____________ Movements: ____________ This information is not intended to replace advice given to you by your health care provider. Make sure you discuss any questions you have with your health care provider. Document Revised: 10/08/2018 Document Reviewed: 10/08/2018 Elsevier Patient Education  2020 Else[employer] Inc.  

## 2019-10-18 NOTE — Progress Notes (Signed)
Pt discharged in stable condition. Pt sent with all belongings.

## 2019-10-19 ENCOUNTER — Telehealth: Payer: Self-pay | Admitting: *Deleted

## 2019-10-19 NOTE — Telephone Encounter (Signed)
Contacted patient to complete transition of care assessment:  Transition Care Management Follow-up Telephone Call   [facility] Managed Care Transition Call Status:MM [facility] Call Made   Date of discharge and from where:[facility], 10/18/19   How have you been since you were released from the hospital? "having contractions right now"; states contractions started about 1:00   Any questions or concerns? No  Items Reviewed:  Did the pt receive and understand the discharge instructions provided? Yes   Medications obtained and verified? n/a  Any new allergies since your discharge? No   Dietary orders reviewed? Yes  Do you have support at home? Yes  Functional Questionnaire: (I = Independent and D = Dependent)  ADLs: Independent Bathing/Dressing:Independent Meal Prep: Independent Eating: Independent Maintaining continence: Independent Transferring/Ambulation: Independent Managing Meds: Independent   Follow up appointments reviewed:   PCP Hospital f/u appt confirmed? No    Specialist Hospital f/u appt confirmed? Scheduled to see Dr Mariel Aloe, [facility]  on 10/20/19@ 1615  Are transportation arrangements needed? No   If their condition worsens, is the pt aware to call PCP or go to the EmergencyDept.? Yes  Was the patient provided with contact information for the PCP's office or ED? Yes  Was to pt encouraged to call back with questions or concerns? Yes  Patient advised to contact the Maternity Admissions Unit or her OB/GYN regarding her symptoms of contractions that are not stopping. She verbalized understanding.

## 2019-10-20 ENCOUNTER — Ambulatory Visit (INDEPENDENT_AMBULATORY_CARE_PROVIDER_SITE_OTHER): Payer: Medicaid Other | Admitting: Obstetrics and Gynecology

## 2019-10-20 ENCOUNTER — Encounter: Payer: Self-pay | Admitting: Obstetrics and Gynecology

## 2019-10-20 ENCOUNTER — Other Ambulatory Visit: Payer: Self-pay

## 2019-10-20 ENCOUNTER — Other Ambulatory Visit (INDEPENDENT_AMBULATORY_CARE_PROVIDER_SITE_OTHER): Payer: Medicaid Other | Admitting: Obstetrics and Gynecology

## 2019-10-20 VITALS — BP 106/65 | HR 82 | Wt 112.0 lb

## 2019-10-20 DIAGNOSIS — Z3480 Encounter for supervision of other normal pregnancy, unspecified trimester: Secondary | ICD-10-CM

## 2019-10-20 DIAGNOSIS — D563 Thalassemia minor: Secondary | ICD-10-CM

## 2019-10-20 DIAGNOSIS — Z8759 Personal history of other complications of pregnancy, childbirth and the puerperium: Secondary | ICD-10-CM

## 2019-10-20 DIAGNOSIS — Z98891 History of uterine scar from previous surgery: Secondary | ICD-10-CM

## 2019-10-20 DIAGNOSIS — O99019 Anemia complicating pregnancy, unspecified trimester: Secondary | ICD-10-CM

## 2019-10-20 DIAGNOSIS — Z141 Cystic fibrosis carrier: Secondary | ICD-10-CM

## 2019-10-20 MED ORDER — FERUMOXYTOL INJECTION 510 MG/17 ML: 510.0000 mg | Freq: Once | INTRAVENOUS | 0 refills | Status: DC

## 2019-10-20 NOTE — Progress Notes (Signed)
ROB   CC: None   Pt went to [facility] on Monday due to contractions states she has been ok since.

## 2019-10-20 NOTE — Patient Instructions (Signed)

## 2019-10-20 NOTE — Progress Notes (Signed)
   PRENATAL VISIT NOTE  Subjective:  Annette Rodriguez is a 24 y.o. E3P2951 at [redacted]w[redacted]d being seen today for ongoing prenatal care.  She is currently monitored for the following issues for this high-risk pregnancy and has History of placenta abruption; History of C-section; Anemia during pregnancy; Cystic fibrosis carrier; Supervision of other normal pregnancy, antepartum; Cystic fibrosis carrier, antepartum; Alpha thalassemia silent carrier; and Preterm contractions on their problem list.  Patient doing well with no acute concerns today. She reports no complaints.  Contractions: Irritability. Vag. Bleeding: None.  Movement: Present. Denies leaking of fluid.   Pt seen, discussed  Birth control.  Pt interested in mirena IUD.  After counseling, pt desires postplacental placement.  Procedure described.  Discussed slightly higher expulsion rate. The following portions of the patient's history were reviewed and updated as appropriate: allergies, current medications, past family history, past medical history, past social history, past surgical history and problem list. Problem list updated.  Objective:   Vitals:   10/20/19 1630  BP: 106/65  Pulse: 82  Weight: 112 lb (50.8 kg)    Fetal Status: Fetal Heart Rate (bpm): 162   Movement: Present     General:  Alert, oriented and cooperative. Patient is in no acute distress.  Skin: Skin is warm and dry. No rash noted.   Cardiovascular: Normal heart rate noted  Respiratory: Normal respiratory effort, no problems with respiration noted  Abdomen: Soft, gravid, appropriate for gestational age.  Pain/Pressure: Present     Pelvic: Cervical exam deferred        Extremities: Normal range of motion.  Edema: None  Mental Status:  Normal mood and affect. Normal behavior. Normal judgment and thought content.   Assessment and Plan:  Pregnancy: G3P1102 at [redacted]w[redacted]d  1. Supervision of other normal pregnancy, antepartum GBS and GC/C completed at previous visit at 33  weeks?  2. History of placenta abruption   3. History of C-section Repeat c section scheduled, pt desires post placental mirena if possible.  4. Cystic fibrosis carrier   5. Alpha thalassemia silent carrier   6. Anemia during pregnancy feraheme ordered to increase hemoglobin prior to surgery  Preterm labor symptoms and general obstetric precautions including but not limited to vaginal bleeding, contractions, leaking of fluid and fetal movement were reviewed in detail with the patient.  Pt just admitted on 10/17/2019 with preterm contractions.  She was monitored with no cervical change and released on 10/18/2019  Please refer to After Visit Summary for other counseling recommendations.   Return in about 1 week (around 10/27/2019) for ROB, in person.   Mariel Aloe, MD

## 2019-10-20 NOTE — Progress Notes (Signed)
No note

## 2019-10-21 ENCOUNTER — Other Ambulatory Visit: Payer: Self-pay | Admitting: Obstetrics and Gynecology

## 2019-10-21 ENCOUNTER — Other Ambulatory Visit: Payer: Self-pay | Admitting: Advanced Practice Midwife

## 2019-10-27 ENCOUNTER — Other Ambulatory Visit: Payer: Self-pay

## 2019-10-27 ENCOUNTER — Ambulatory Visit (INDEPENDENT_AMBULATORY_CARE_PROVIDER_SITE_OTHER): Payer: Medicaid Other | Admitting: Advanced Practice Midwife

## 2019-10-27 DIAGNOSIS — Z3480 Encounter for supervision of other normal pregnancy, unspecified trimester: Secondary | ICD-10-CM

## 2019-10-27 MED ORDER — FERUMOXYTOL INJECTION 510 MG/17 ML: 510.0000 mg | Freq: Once | INTRAVENOUS | 0 refills | Status: DC

## 2019-10-27 NOTE — Patient Instructions (Signed)
COVID-19 Vaccination if You Are Pregnant or Breastfeeding ° °The Society for Maternal-Fetal Medicine (SMFM) and other pregnancy experts recommend that pregnant and lactating people be vaccinated against COVID-19. The Centers for Disease Control and Prevention (CDC) also recommend vaccination for “all people aged 25 years and older, including people who are pregnant, breastfeeding, trying to get pregnant now, or might become pregnant in the future.” Vaccination is the best way to reduce the risks of COVID-19 infection and COVID-related complications for both you and your baby. ° °Three vaccines are available to prevent COVID-19: °• The two-dose Pfizer vaccine for people 12 years and older--APPROVED by the [place] Food and Drug Administration on October 25, 2019 °• The two-dose Moderna vaccine for people 18 years and older--AUTHORIZED for emergency use °• The one-dose Johnson & Johnson vaccine for people 18 years and older (you may also see this vaccine referred to as the “Janssen vaccine”)--AUTHORIZED for emergency use ° °For those receiving the Pfizer and Moderna vaccines, the second dose is given 21 days (Pfizer) and 28 days (Moderna) after the first dose. The Johnson & Johnson vaccine is only one dose. ° °Information for Pregnant Individuals °If you are pregnant or planning to become pregnant and are thinking about getting vaccinated, consider talking with your health care professional about the vaccine.  ° °To help with your decision, you should consider the following key points: °Anyone can get the COVID vaccines free of charge regardless of immigration status or whether they have insurance. You may be asked for your social security number, but it is  °NOT required to get vaccinated. ° °What are benefits of getting the COVID-19 vaccines during pregnancy?  °• The vaccines can help protect you from getting COVID-19. With the two-dose vaccines, you must get both doses for maximum effectiveness. It’s not yet known how  long protection lasts. ° °• Another potential benefit is that getting the vaccine while pregnant may help you pass antiCOVID-19 antibodies to your baby. In numerous studies of vaccinated moms, antibodies were found in the umbilical cord blood of babies and in the mother’s breastmilk. ° °• The CDC, along with other federal partners, are monitoring people who have been vaccinated for serious side effects. So far, more than 139,000 pregnant people have been °vaccinated. No unexpected pregnancy or fetal problems have occurred. There have been no reports of any increased risk of pregnancy loss, growth problems, or birth defects. ° °• A safe vaccine is generally considered one in which the benefits of being vaccinated outweigh the risks. The current vaccines are not live vaccines. There is only a very small  °chance that they cross the placenta, so it’s unlikely that they even reach the fetus. Vaccines don’t affect future fertility. The only people who should NOT get vaccinated are those who have had a severe allergic reaction to vaccines in the past or any vaccine ingredients. ° °• Side effects may occur in the first 3 days after getting vaccinated.1 These include mild to moderate fever, headache, and muscle aches. Side effects may be worse after the second dose of the Pfizer and Moderna vaccines. Fever should be avoided during pregnancy,especially in the first trimester. Those who develop a fever after vaccination can take °acetaminophen (Tylenol). This medication is safe to use during pregnancy and does not  affect how the vaccine works.  ° °What are the known risks of getting COVID-19 during pregnancy?  °About 1 to 3 per 1,000 pregnant women with COVID-19 will develop severe disease. Compared with those who   aren’t pregnant, pregnant people infected by the COVID-19 virus: °• Are 3 times more likely to need ICU care °• Are 2 to 3 times more likely to need advanced life support and a breathing tube  °• Have a small  increased risk of dying due to COVID-19 °They may also be at increased risk of stillbirth and preterm birth. ° °What is my risk of getting COVID-19?  °Your risk of getting COVID-19 depends on the chance that you will come into contact with another infected person. The risk may be higher if you live in a community where there is a lot of COVID-19 infection or work in healthcare or another high-contact setting.  ° °What is my risk for severe complications if I get COVID-19?  °Data show that older pregnant women; those with preexisting health conditions, such as a body mass index higher than 35 kg/m2, diabetes, and heart disorders; and Black or Latinx women have an especially increased risk of severe disease and death from COVID-19. °  °If you still have questions about the vaccines or need more information, ask your health care provider or go to the Centers for Disease Control and Prevention’s COVID-19 vaccine webpage.  ° °An Update on the Johnson & Johnson Vaccine  ° °In April 2021, the FDA and CDC called for a brief pause to use of the Johnson & Johnson vaccine. They did so after reports of a severe side effect in a very small number of women younger than age 50 following vaccination. This side effect, called thrombosis with thrombocytopenia syndrome (TTS), causes blood clots (thrombosis) combined with low levels of platelets (thrombocytopenia). ° °TTS following the Johnson & Johnson vaccine is extremely rare. At the time of this update, it has occurred in only 7 people per 1 million Johnson & Johnson shots given. According to the CDC, being on hormonal birth control (the pill, patch, or ring), pregnancy, breastfeeding, or being recently pregnant does not make you more likely to develop TTS after getting the Johnson & Johnson vaccine. The pause was lifted on June 25, 2019, after the FDA and CDC determined that the known benefits of the Johnson & Johnson vaccine far outweigh the risks.  ° °Health care professionals  have been alerted to the possibility of this side effect in people who have received the Johnson & Johnson vaccine. National organizations continue to recommend COVID-19 vaccination with any of the vaccines for pregnant women. All women younger than age 50 years, whether pregnant, breastfeeding, or not, should be aware of the very rare risk of TTS after getting the Johnson & Johnson vaccine. The Pfizer and Moderna vaccines don’t have this risk. If you get the Johnson & Johnson vaccine,  °seek medical help right away if you develop any of the following symptoms within 3 weeks of getting your shot: ° °• Severe or persistent headaches or blurred vision °• Shortness of breath °• Chest pain °• Leg swelling °• Persistent abdominal pain °• Easy bruising or tiny blood spots under the skin beyond the injection site ° °Experts continue to collect health and safety information from pregnant people who have been vaccinated. If you have questions about vaccination during pregnancy, visit the CDC website or talk to your health care professional. Information for Breastfeeding/Lactating Individuals The Society for Maternal-Fetal Medicine and other pregnancy experts recommend COVID-19 vaccination for people who are breastfeeding/lactating. You don’t have to delay or stop  °breastfeeding just because you get vaccinated.  ° °Getting Vaccinated  °You can get vaccinated at   any time during pregnancy. The CDC is committed to monitoring the vaccine’s safety for all individuals. Your health professional or vaccine clinic may give you information about enrolling in the v-safe after vaccination health checker (see the box below).Even after you’re fully vaccinated, it is important to follow the CDC’s guidance for wearing a mask indoors in areas where there are substantial or high rates of COVID-19 infection.  ° °What Happens When You Enroll in v-Safe?  °The v-safe after vaccination health checker program lets the CDC check in with you after  your vaccination. At sign-up, you can indicate that you are pregnant. Once you do that, expect the following: °• Someone may call you from the v-safe program to ask initial questions and get more information. °• You may be asked to enroll in the vaccine pregnancy registry, which is collecting information about any effects of the vaccine during pregnancy. This is a great way to help scientists monitor the vaccine’s safety and effectiveness.  ° °References °1. Oliver SE, Gargano JW, Marin M, Wallace M, Curran KG, Chamberland M, et al. The Advisory Committee on Immunization Practices’ Interim Recommendation for Use of Pfizer-BioNTech  °COVID-19 Vaccine -- [place], December 2020. MMWR Morbidity and Mortality Weekly Report 2020;69. °2. FDA Briefing Document. Janssen Ad26.COV2.S Vaccine for the Prevention of COVID-19. 2021. Accessed May 07, 2019; Available from: https://www.fda.gov/media/146217/download °3. PFIZER-BIONTECH COVID-19 VACCINE [package insert] [place]: Pfizer and [place], [place]: [employer]iontech;2020. °4. FDA Briefing Document. Moderna COVID-19 Vaccine. 2020. Accessed 2020, Dec 18; Available from: https://www.fda.gov/media/144434/download  °5. Gray KJ, Bordt EA, Atyeo C, Deriso E, Akinwunmi B, Young N, et al. COVID-19 vaccine response in pregnant and lactating women: a cohort study. Am J Obstet Gynecol 2021 Mar 24. °6. Panagiotakopoulos L, Myers TR, Gee J, Lipkind HS, Kharbanda EO, Ryan DS, et al. SARS-CoV-2 Infection Among Hospitalized Pregnant Women: Reasons for Admission and Pregnancy  °Characteristics - [facility], March 1-Aug 01, 2018. MMWR Morb Mortal Wkly Rep 2020 Sep 23;69(38):1355-9. °7. Zambrano LD, Ellington S, Strid P, Galang RR, Oduyebo T, Tong VT, et al. Update: Characteristics of Symptomatic Women of Reproductive Age with Laboratory-Confirmed SARSCoV-2 Infection by Pregnancy Status - [place], January 22-December 05, 2018. MMWR Morb Mortal Wkly Rep 2020 Nov  6;69(44):1641-7. °8. Delahoy MJ, Whitaker M, O'Halloran A, Chai SJ, Kirley PD, Alden N, et al. Characteristics and Maternal and Birth Outcomes of Hospitalized Pregnant Women with Laboratory-Confirmed COVID-19 - COVID-NET, 13 States, March 1-October 24, 2018. MMWR Morb Mortal Wkly Rep 2020 Sep 25;69(38):1347-54. ° °

## 2019-10-27 NOTE — Progress Notes (Signed)
   PRENATAL VISIT NOTE  Subjective:  Annette Rodriguez is a 24 y.o. [id number] at [redacted]w[redacted]d being seen today for ongoing prenatal care.  She is currently monitored for the following issues for this high-risk pregnancy and has History of placenta abruption; History of C-section; Anemia during pregnancy; Cystic fibrosis carrier; Supervision of other normal pregnancy, antepartum; Cystic fibrosis carrier, antepartum; Alpha thalassemia silent carrier; and Preterm contractions on their problem list.  Patient reports no complaints.  Contractions: Not present. Vag. Bleeding: None.  Movement: Present. Denies leaking of fluid.   The following portions of the patient's history were reviewed and updated as appropriate: allergies, current medications, past family history, past medical history, past social history, past surgical history and problem list.   Objective:   Vitals:   10/27/19 1549  BP: 104/64  Pulse: (!) 116  Weight: 111 lb (50.3 kg)    Fetal Status: Fetal Heart Rate (bpm): 142 Fundal Height: 35 cm Movement: Present     General:  Alert, oriented and cooperative. Patient is in no acute distress.  Skin: Skin is warm and dry. No rash noted.   Cardiovascular: Normal heart rate noted  Respiratory: Normal respiratory effort, no problems with respiration noted  Abdomen: Soft, gravid, appropriate for gestational age.  Pain/Pressure: Absent     Pelvic: Cervical exam deferred        Extremities: Normal range of motion.  Edema: None  Mental Status: Normal mood and affect. Normal behavior. Normal judgment and thought content.   Assessment and Plan:  Pregnancy: G3P1102 at [redacted]w[redacted]d 1. Supervision of other normal pregnancy, antepartum - routine care - Patient was to have an iron infusion, but states that she has not been called to schedule this appt yet.  Annette Rodriguez ordered, front desk staff will schedule this for patient.    Term labor symptoms and general obstetric precautions including but not limited to  vaginal bleeding, contractions, leaking of fluid and fetal movement were reviewed in detail with the patient. Please refer to After Visit Summary for other counseling recommendations.   Return in about 1 week (around 11/03/2019).  No future appointments.  Thressa Sheller DNP, CNM  10/27/19  4:14 PM

## 2019-10-27 NOTE — Progress Notes (Signed)
ROB GBS  + positive  On 10/07/19 GC/CT Negative on 10/07/19 Pt declines Cervix check today.    CC: pt states last provider stated she was suppose to get a call regarding iron transfusion pt states she never received a call.

## 2019-11-03 ENCOUNTER — Encounter (HOSPITAL_COMMUNITY): Payer: Self-pay | Admitting: Obstetrics and Gynecology

## 2019-11-03 ENCOUNTER — Telehealth: Payer: Self-pay | Admitting: *Deleted

## 2019-11-03 ENCOUNTER — Other Ambulatory Visit: Payer: Self-pay

## 2019-11-03 ENCOUNTER — Inpatient Hospital Stay (HOSPITAL_COMMUNITY)
Admission: AD | Admit: 2019-11-03 | Discharge: 2019-11-03 | Disposition: A | Discharge status: Home / Self Care | Payer: Medicaid Other | Attending: Obstetrics and Gynecology | Admitting: Obstetrics and Gynecology

## 2019-11-03 ENCOUNTER — Ambulatory Visit (INDEPENDENT_AMBULATORY_CARE_PROVIDER_SITE_OTHER): Payer: Medicaid Other | Admitting: Women's Health

## 2019-11-03 VITALS — BP 115/65 | HR 98 | Wt 109.2 lb

## 2019-11-03 DIAGNOSIS — Z98891 History of uterine scar from previous surgery: Secondary | ICD-10-CM

## 2019-11-03 DIAGNOSIS — O0933 Supervision of pregnancy with insufficient antenatal care, third trimester: Secondary | ICD-10-CM

## 2019-11-03 DIAGNOSIS — Z3483 Encounter for supervision of other normal pregnancy, third trimester: Secondary | ICD-10-CM | POA: Insufficient documentation

## 2019-11-03 DIAGNOSIS — O99019 Anemia complicating pregnancy, unspecified trimester: Secondary | ICD-10-CM

## 2019-11-03 DIAGNOSIS — Z3480 Encounter for supervision of other normal pregnancy, unspecified trimester: Secondary | ICD-10-CM

## 2019-11-03 DIAGNOSIS — O0930 Supervision of pregnancy with insufficient antenatal care, unspecified trimester: Secondary | ICD-10-CM | POA: Insufficient documentation

## 2019-11-03 DIAGNOSIS — Z0371 Encounter for suspected problem with amniotic cavity and membrane ruled out: Secondary | ICD-10-CM

## 2019-11-03 DIAGNOSIS — Z3A37 37 weeks gestation of pregnancy: Secondary | ICD-10-CM | POA: Diagnosis not present

## 2019-11-03 DIAGNOSIS — Z8759 Personal history of other complications of pregnancy, childbirth and the puerperium: Secondary | ICD-10-CM

## 2019-11-03 DIAGNOSIS — D563 Thalassemia minor: Secondary | ICD-10-CM

## 2019-11-03 DIAGNOSIS — O09899 Supervision of other high risk pregnancies, unspecified trimester: Secondary | ICD-10-CM

## 2019-11-03 DIAGNOSIS — Z141 Cystic fibrosis carrier: Secondary | ICD-10-CM

## 2019-11-03 DIAGNOSIS — O26843 Uterine size-date discrepancy, third trimester: Secondary | ICD-10-CM

## 2019-11-03 LAB — URINALYSIS, ROUTINE W REFLEX MICROSCOPIC
Bilirubin Urine: NEGATIVE
Glucose, UA: NEGATIVE mg/dL
Hgb urine dipstick: NEGATIVE
Ketones, ur: NEGATIVE mg/dL
Leukocytes,Ua: NEGATIVE
Nitrite: NEGATIVE
Protein, ur: NEGATIVE mg/dL
Specific Gravity, Urine: 1.016 (ref 1.005–1.030)
pH: 7 (ref 5.0–8.0)

## 2019-11-03 LAB — POCT FERN TEST: POCT Fern Test: NEGATIVE

## 2019-11-03 NOTE — Discharge Instructions (Signed)
Signs and Symptoms of Labor Labor is your body's natural process of moving your baby, placenta, and umbilical cord out of your uterus. The process of labor usually starts when your baby is full-term, between 37 and 40 weeks of pregnancy. How will I know when I am close to going into labor? As your body prepares for labor and the birth of your baby, you may notice the following symptoms in the weeks and days before true labor starts:  Having a strong desire to get your home ready to receive your new baby. This is called nesting. Nesting may be a sign that labor is approaching, and it may occur several weeks before birth. Nesting may involve cleaning and organizing your home.  Passing a small amount of thick, bloody mucus out of your vagina (normal bloody show or losing your mucus plug). This may happen more than a week before labor begins, or it might occur right before labor begins as the opening of the cervix starts to widen (dilate). For some women, the entire mucus plug passes at once. For others, smaller portions of the mucus plug may gradually pass over several days.  Your baby moving (dropping) lower in your pelvis to get into position for birth (lightening). When this happens, you may feel more pressure on your bladder and pelvic bone and less pressure on your ribs. This may make it easier to breathe. It may also cause you to need to urinate more often and have problems with bowel movements.  Having "practice contractions" (Braxton Hicks contractions) that occur at irregular (unevenly spaced) intervals that are more than 10 minutes apart. This is also called false labor. False labor contractions are common after exercise or sexual activity, and they will stop if you change position, rest, or drink fluids. These contractions are usually mild and do not get stronger over time. They may feel like: ? A backache or back pain. ? Mild cramps, similar to menstrual cramps. ? Tightening or pressure in  your abdomen. Other early symptoms that labor may be starting soon include:  Nausea or loss of appetite.  Diarrhea.  Having a sudden burst of energy, or feeling very tired.  Mood changes.  Having trouble sleeping. How will I know when labor has begun? Signs that true labor has begun may include:  Having contractions that come at regular (evenly spaced) intervals and increase in intensity. This may feel like more intense tightening or pressure in your abdomen that moves to your back. ? Contractions may also feel like rhythmic pain in your upper thighs or back that comes and goes at regular intervals. ? For first-time mothers, this change in intensity of contractions often occurs at a more gradual pace. ? Women who have given birth before may notice a more rapid progression of contraction changes.  Having a feeling of pressure in the vaginal area.  Your water breaking (rupture of membranes). This is when the sac of fluid that surrounds your baby breaks. When this happens, you will notice fluid leaking from your vagina. This may be clear or blood-tinged. Labor usually starts within 24 hours of your water breaking, but it may take longer to begin. ? Some women notice this as a gush of fluid. ? Others notice that their underwear repeatedly becomes damp. Follow these instructions at home:   When labor starts, or if your water breaks, call your health care provider or nurse care line. Based on your situation, they will determine when you should go in for an   exam.  When you are in early labor, you may be able to rest and manage symptoms at home. Some strategies to try at home include: ? Breathing and relaxation techniques. ? Taking a warm bath or shower. ? Listening to music. ? Using a heating pad on the lower back for pain. If you are directed to use heat:  Place a towel between your skin and the heat source.  Leave the heat on for 20-30 minutes.  Remove the heat if your skin turns  bright red. This is especially important if you are unable to feel pain, heat, or cold. You may have a greater risk of getting burned. Get help right away if:  You have painful, regular contractions that are 5 minutes apart or less.  Labor starts before you are [redacted] weeks along in your pregnancy.  You have a fever.  You have a headache that does not go away.  You have bright red blood coming from your vagina.  You do not feel your baby moving.  You have a sudden onset of: ? Severe headache with vision problems. ? Nausea, vomiting, or diarrhea. ? Chest pain or shortness of breath. These symptoms may be an emergency. If your health care provider recommends that you go to the hospital or birth center where you plan to deliver, do not drive yourself. Have someone else drive you, or call emergency services (911 in the [place] Summary  Labor is your body's natural process of moving your baby, placenta, and umbilical cord out of your uterus.  The process of labor usually starts when your baby is full-term, between 37 and 40 weeks of pregnancy.  When labor starts, or if your water breaks, call your health care provider or nurse care line. Based on your situation, they will determine when you should go in for an exam. This information is not intended to replace advice given to you by your health care provider. Make sure you discuss any questions you have with your health care provider. Document Revised: 11/18/2016 Document Reviewed: 07/26/2016 Elsevier Patient Education  2020 Elsevier Inc. Fetal Movement Counts Patient Name: ________________________________________________ Patient Due Date: ____________________ What is a fetal movement count?  A fetal movement count is the number of times that you feel your baby move during a certain amount of time. This may also be called a fetal kick count. A fetal movement count is recommended for every pregnant woman. You may be asked to start counting  fetal movements as early as week 28 of your pregnancy. Pay attention to when your baby is most active. You may notice your baby's sleep and wake cycles. You may also notice things that make your baby move more. You should do a fetal movement count:  When your baby is normally most active.  At the same time each day. A good time to count movements is while you are resting, after having something to eat and drink. How do I count fetal movements? 1. Find a quiet, comfortable area. Sit, or lie down on your side. 2. Write down the date, the start time and stop time, and the number of movements that you felt between those two times. Take this information with you to your health care visits. 3. Write down your start time when you feel the first movement. 4. Count kicks, flutters, swishes, rolls, and jabs. You should feel at least 10 movements. 5. You may stop counting after you have felt 10 movements, or if you have been counting for 2   hours. Write down the stop time. 6. If you do not feel 10 movements in 2 hours, contact your health care provider for further instructions. Your health care provider may want to do additional tests to assess your baby's well-being. Contact a health care provider if:  You feel fewer than 10 movements in 2 hours.  Your baby is not moving like he or she usually does. Date: ____________ Start time: ____________ Stop time: ____________ Movements: ____________ Date: ____________ Start time: ____________ Stop time: ____________ Movements: ____________ Date: ____________ Start time: ____________ Stop time: ____________ Movements: ____________ Date: ____________ Start time: ____________ Stop time: ____________ Movements: ____________ Date: ____________ Start time: ____________ Stop time: ____________ Movements: ____________ Date: ____________ Start time: ____________ Stop time: ____________ Movements: ____________ Date: ____________ Start time: ____________ Stop time:  ____________ Movements: ____________ Date: ____________ Start time: ____________ Stop time: ____________ Movements: ____________ Date: ____________ Start time: ____________ Stop time: ____________ Movements: ____________ This information is not intended to replace advice given to you by your health care provider. Make sure you discuss any questions you have with your health care provider. Document Revised: 10/08/2018 Document Reviewed: 10/08/2018 Elsevier Patient Education  2020 Elsevier Inc.  

## 2019-11-03 NOTE — Progress Notes (Signed)
Subjective:  Annette Rodriguez is a 24 y.o. G3P1102 at [redacted]w[redacted]d being seen today for ongoing prenatal care.  She is currently monitored for the following issues for this high-risk pregnancy and has History of placenta abruption; History of C-section; Anemia during pregnancy; Supervision of other normal pregnancy, antepartum; Cystic fibrosis carrier, antepartum; Alpha thalassemia silent carrier; Preterm contractions; and Insufficient prenatal care on their problem list.  Patient reports no complaints.  Contractions: Not present. Vag. Bleeding: None.  Movement: Present. Denies leaking of fluid.   The following portions of the patient's history were reviewed and updated as appropriate: allergies, current medications, past family history, past medical history, past social history, past surgical history and problem list. Problem list updated.  Objective:   Vitals:   11/03/19 1440  BP: 115/65  Pulse: 98  Weight: 109 lb 3.2 oz (49.5 kg)    Fetal Status: Fetal Heart Rate (bpm): 142 Fundal Height: 31 cm Movement: Present     General:  Alert, oriented and cooperative. Patient is in no acute distress.  Skin: Skin is warm and dry. No rash noted.   Cardiovascular: Normal heart rate noted  Respiratory: Normal respiratory effort, no problems with respiration noted  Abdomen: Soft, gravid, appropriate for gestational age. Pain/Pressure: Present     Pelvic: Vag. Bleeding: None     Cervical exam deferred        Extremities: Normal range of motion.  Edema: None  Mental Status: Normal mood and affect. Normal behavior. Normal judgment and thought content.   Urinalysis:      Assessment and Plan:  Pregnancy: G3P1102 at [redacted]w[redacted]d  1. Supervision of other normal pregnancy, antepartum -peds list given -no glucose test performed as patient missed 3 months of prenatal visits -A1C @33wks  5.6% -Random glucose today: 97 -per Dr. , do not need to perform glucose testing at this time  2. Cystic fibrosis carrier,  antepartum -GC 06/2019  3. Alpha thalassemia silent carrier -GC 06/2019  4. Anemia during pregnancy -scheduled for [facility] tomorrow -hgb 8.9/9.2 this month  5. History of placenta abruption  6. History of C-section -hx x2, C/S scheduled 11/16/2019 @39wks   7. Insufficient prenatal care in third trimester - POCT Glucose (CBG)  8. Uterine size-date discrepancy in third trimester - FH 53 - MFM OB FOLLOW UP; Future   Term labor symptoms and general obstetric precautions including but not limited to vaginal bleeding, contractions, leaking of fluid and fetal movement were reviewed in detail with the patient. I discussed the assessment and treatment plan with the patient. The patient was provided an opportunity to ask questions and all were answered. The patient agreed with the plan and demonstrated an understanding of the instructions. The patient was advised to call back or seek an in-person office evaluation/go to [facility] at [facility] for any urgent or concerning symptoms. Please refer to After Visit Summary for other counseling recommendations.  Return in about 1 week (around 11/10/2019) for in-person LOB/MD ONLY/C/S consent, needs growth scan ASAP.   Mykale Gandolfo, [facility], NP

## 2019-11-03 NOTE — Patient Instructions (Addendum)
Maternity Assessment Unit (MAU)  The Maternity Assessment Unit (MAU) is located at the [facility] and [place] [facility]. The address is: [address], [place], [place], [place] [address]. Please see map below for additional directions.    The Maternity Assessment Unit is designed to help you during your pregnancy, and for up to 6 weeks after delivery, with any pregnancy- or postpartum-related emergencies, if you think you are in labor, or if your water has broken. For example, if you experience nausea and vomiting, vaginal bleeding, severe abdominal or pelvic pain, elevated blood pressure or other problems related to your pregnancy or postpartum time, please come to the Maternity Assessment Unit for assistance.       Signs and Symptoms of Labor Labor is your body's natural process of moving your baby, placenta, and umbilical cord out of your uterus. The process of labor usually starts when your baby is full-term, between 30 and 40 weeks of pregnancy. How will I know when I am close to going into labor? As your body prepares for labor and the birth of your baby, you may notice the following symptoms in the weeks and days before true labor starts:  Having a strong desire to get your home ready to receive your new baby. This is called nesting. Nesting may be a sign that labor is approaching, and it may occur several weeks before birth. Nesting may involve cleaning and organizing your home.  Passing a small amount of thick, bloody mucus out of your vagina (normal bloody show or losing your mucus plug). This may happen more than a week before labor begins, or it might occur right before labor begins as the opening of the cervix starts to widen (dilate). For some women, the entire mucus plug passes at once. For others, smaller portions of the mucus plug may gradually pass over several days.  Your baby moving (dropping) lower in your pelvis to get into position for birth  (lightening). When this happens, you may feel more pressure on your bladder and pelvic bone and less pressure on your ribs. This may make it easier to breathe. It may also cause you to need to urinate more often and have problems with bowel movements.  Having "practice contractions" (Braxton Hicks contractions) that occur at irregular (unevenly spaced) intervals that are more than 10 minutes apart. This is also called false labor. False labor contractions are common after exercise or sexual activity, and they will stop if you change position, rest, or drink fluids. These contractions are usually mild and do not get stronger over time. They may feel like: ? A backache or back pain. ? Mild cramps, similar to menstrual cramps. ? Tightening or pressure in your abdomen. Other early symptoms that labor may be starting soon include:  Nausea or loss of appetite.  Diarrhea.  Having a sudden burst of energy, or feeling very tired.  Mood changes.  Having trouble sleeping. How will I know when labor has begun? Signs that true labor has begun may include:  Having contractions that come at regular (evenly spaced) intervals and increase in intensity. This may feel like more intense tightening or pressure in your abdomen that moves to your back. ? Contractions may also feel like rhythmic pain in your upper thighs or back that comes and goes at regular intervals. ? For first-time mothers, this change in intensity of contractions often occurs at a more gradual pace. ? Women who have given birth before may notice a  more rapid progression of contraction changes.  Having a feeling of pressure in the vaginal area.  Your water breaking (rupture of membranes). This is when the sac of fluid that surrounds your baby breaks. When this happens, you will notice fluid leaking from your vagina. This may be clear or blood-tinged. Labor usually starts within 24 hours of your water breaking, but it may take longer to  begin. ? Some women notice this as a gush of fluid. ? Others notice that their underwear repeatedly becomes damp. Follow these instructions at home:   When labor starts, or if your water breaks, call your health care provider or nurse care line. Based on your situation, they will determine when you should go in for an exam.  When you are in early labor, you may be able to rest and manage symptoms at home. Some strategies to try at home include: ? Breathing and relaxation techniques. ? Taking a warm bath or shower. ? Listening to music. ? Using a heating pad on the lower back for pain. If you are directed to use heat:  Place a towel between your skin and the heat source.  Leave the heat on for 20-30 minutes.  Remove the heat if your skin turns bright red. This is especially important if you are unable to feel pain, heat, or cold. You may have a greater risk of getting burned. Get help right away if:  You have painful, regular contractions that are 5 minutes apart or less.  Labor starts before you are [redacted] weeks along in your pregnancy.  You have a fever.  You have a headache that does not go away.  You have bright red blood coming from your vagina.  You do not feel your baby moving.  You have a sudden onset of: ? Severe headache with vision problems. ? Nausea, vomiting, or diarrhea. ? Chest pain or shortness of breath. These symptoms may be an emergency. If your health care provider recommends that you go to the hospital or birth center where you plan to deliver, do not drive yourself. Have someone else drive you, or call emergency services (911 in the [place] Summary  Labor is your body's natural process of moving your baby, placenta, and umbilical cord out of your uterus.  The process of labor usually starts when your baby is full-term, between 61 and 40 weeks of pregnancy.  When labor starts, or if your water breaks, call your health care provider or nurse care line. Based  on your situation, they will determine when you should go in for an exam. This information is not intended to replace advice given to you by your health care provider. Make sure you discuss any questions you have with your health care provider. Document Revised: 11/18/2016 Document Reviewed: 07/26/2016 Elsevier Patient Education  2020 Elsevier Inc.       AREA PEDIATRIC/FAMILY PRACTICE PHYSICIANS  [facility] [address] [place], [place] [address] Phone - [phone]   Fax - [phone]  JACK AMOS [address] [place], [place]  [address] Phone - [phone]   Fax - [phone]  [facility] [address] [place], [place]  [address] Phone - [phone]   Fax - [phone]  [facility] [address] [place], [place]  [address] Phone - [phone]   Fax - [phone]  [facility] [address], [address] [place], [place]  [address] Phone - [phone]   Fax -  [phone]  [facility] [address] [place], [place]  [address] Phone - [phone]   Fax - [phone]  [facility] [place] [address], [address] [place], [place]  [address] Phone - [phone]   Fax - [phone]  [facility] AT [facility] [address] [place], [address] [place], [place]  [address] Phone - [phone]   Fax - [phone]  [facility] AT [facility] [address] [place], [place]  [address] Phone - [phone]   Fax - [phone] [facility] AT [place] [address] [place], [place]  [address] Phone - [phone]   Fax - [phone]  [facility] AT [facility] [address] [place], [place]  [address] Phone - [phone]   Fax - [phone]  [facility] [address] [place], [place]  [address] Phone - [phone]   Fax - [phone]  [facility] [place] [address] [place], [place]  [address] Phone - [phone]   Fax - [phone]  [facility] [address] [place], [place]  [address] Phone - [phone]  [facility] [address] [place], [place]  [address] Phone - [phone]   Fax - [phone]  [facility] [address] [place], [place]  [address] Phone - [phone]   Fax - [phone]  [facility] [address] [place], [place]  [address] Phone - [phone]   Fax - [phone]  [facility] [address] [place], [place]  [address] Phone - [phone]   Fax - [phone]   [facility] [address] [place], [place]  [address] Phone - [phone]   Fax - [phone]  [facility] [address] [place], [place]  [address] Phone - [phone]   Fax - [phone]  DAVID RUBIN [address] [place], [place]  [address] Phone - [phone]   Fax - [phone]  [facility] [address] [place], [place]  [address] Phone - [phone]   Fax - [phone]  [place] [facility] Alita Chyle [address] [place], [place]  [address] Phone - [phone]   Fax - [phone] Gerarda Fraction [address] [place], [place]  [address] Phone - [phone]   Fax - [phone]  [facility] [address] [place], [place]  [address] Phone - [phone]   Fax - [phone]  [facility] - [place] [address], [address] [place], [place]  [address] Phone - [phone]   Fax - [phone]

## 2019-11-03 NOTE — Telephone Encounter (Signed)
Pt called to office stating she had a gush of fluid when she got up this morning. Pt does not think it was urine- states it was thicker.  Pt states it was not a large amount.  Pt was advised to be seen at hospital for eval and determine if ROM.  Pt states understanding.

## 2019-11-03 NOTE — MAU Provider Note (Signed)
First Provider Initiated Contact with Patient 11/03/19 1209       S: [name] is a 24 y.o. [id number] at [redacted]w[redacted]d  who presents to MAU today complaining of leaking of fluid since this morning. Has not continued. She denies vaginal bleeding. She denies contractions. She reports normal fetal movement.    O: BP 115/66 (BP Location: Right Arm)   Pulse (!) 114   Temp 98.2 F (36.8 C) (Oral)   Resp 18   Ht 4\' 11"  (1.499 m)   Wt 49.8 kg   LMP 02/16/2019 (Approximate)   SpO2 99%   BMI 22.18 kg/m  GENERAL: Well-developed, well-nourished female in no acute distress.  HEAD: Normocephalic, atraumatic.  CHEST: Normal effort of breathing, regular heart rate ABDOMEN: Soft, nontender, gravid PELVIC: Normal external female genitalia. Vagina is pink and rugated. Cervix with normal contour, no lesions. Normal discharge.  No pooling.   Cervical exam:  Dilation: 1 Exam by:: n druebbisch rn   Fetal Monitoring: Baseline: 145 Variability: moderate Accelerations: 15x15 Decelerations: no Contractions: UI  Results for orders placed or performed during the hospital encounter of 11/03/19 (from the past 24 hour(s))  Urinalysis, Routine w reflex microscopic Urine, Clean Catch     Status: None   Collection Time: 11/03/19 12:00 PM  Result Value Ref Range   Color, Urine YELLOW YELLOW   APPearance CLEAR CLEAR   Specific Gravity, Urine 1.016 1.005 - 1.030   pH 7.0 5.0 - 8.0   Glucose, UA NEGATIVE NEGATIVE mg/dL   Hgb urine dipstick NEGATIVE NEGATIVE   Bilirubin Urine NEGATIVE NEGATIVE   Ketones, ur NEGATIVE NEGATIVE mg/dL   Protein, ur NEGATIVE NEGATIVE mg/dL   Nitrite NEGATIVE NEGATIVE   Leukocytes,Ua NEGATIVE NEGATIVE  POCT fern test     Status: Normal   Collection Time: 11/03/19 12:10 PM  Result Value Ref Range   POCT Fern Test Negative = intact amniotic membranes      A: [facility] at [redacted]w[redacted]d  Membranes intact  P: Discharge home  F/u with OB Discussed reasons to return to  MAU  [redacted]w[redacted]d, NP 11/03/2019 12:24 PM

## 2019-11-03 NOTE — MAU Note (Signed)
Presents with c/o gush of fluid this morning, only occurred x1.  Reports wasn't watery but mucuosy. Denies VB.  Endorses +FM.

## 2019-11-04 ENCOUNTER — Inpatient Hospital Stay (HOSPITAL_COMMUNITY): Admission: RE | Admit: 2019-11-04 | Payer: Medicaid Other | Source: Ambulatory Visit

## 2019-11-04 ENCOUNTER — Other Ambulatory Visit: Payer: Self-pay | Admitting: Advanced Practice Midwife

## 2019-11-04 NOTE — Progress Notes (Signed)
Orders places for fereheme infusion scheduled for 11/04/19  Thressa Sheller DNP, CNM  11/04/19  11:42 AM

## 2019-11-05 ENCOUNTER — Ambulatory Visit: Payer: Medicaid Other | Attending: Women's Health

## 2019-11-05 ENCOUNTER — Ambulatory Visit: Payer: Medicaid Other

## 2019-11-06 ENCOUNTER — Inpatient Hospital Stay (HOSPITAL_COMMUNITY)
Admission: AD | Admit: 2019-11-06 | Discharge: 2019-11-08 | DRG: 786 | Disposition: A | Discharge status: Home / Self Care | Payer: Medicaid Other | Attending: Obstetrics and Gynecology | Admitting: Obstetrics and Gynecology

## 2019-11-06 ENCOUNTER — Inpatient Hospital Stay (HOSPITAL_COMMUNITY): Payer: Medicaid Other | Admitting: Anesthesiology

## 2019-11-06 ENCOUNTER — Encounter (HOSPITAL_COMMUNITY): Payer: Self-pay | Admitting: Obstetrics and Gynecology

## 2019-11-06 ENCOUNTER — Other Ambulatory Visit: Payer: Self-pay

## 2019-11-06 ENCOUNTER — Encounter (HOSPITAL_COMMUNITY)
Admission: AD | Disposition: A | Discharge status: Home / Self Care | Payer: Self-pay | Source: Home / Self Care | Attending: Obstetrics and Gynecology

## 2019-11-06 DIAGNOSIS — O98513 Other viral diseases complicating pregnancy, third trimester: Secondary | ICD-10-CM | POA: Diagnosis not present

## 2019-11-06 DIAGNOSIS — Z30430 Encounter for insertion of intrauterine contraceptive device: Secondary | ICD-10-CM

## 2019-11-06 DIAGNOSIS — U071 COVID-19: Secondary | ICD-10-CM | POA: Diagnosis present

## 2019-11-06 DIAGNOSIS — O9902 Anemia complicating childbirth: Secondary | ICD-10-CM | POA: Diagnosis not present

## 2019-11-06 DIAGNOSIS — O9852 Other viral diseases complicating childbirth: Secondary | ICD-10-CM | POA: Diagnosis not present

## 2019-11-06 DIAGNOSIS — Z98891 History of uterine scar from previous surgery: Secondary | ICD-10-CM

## 2019-11-06 DIAGNOSIS — O99824 Streptococcus B carrier state complicating childbirth: Secondary | ICD-10-CM | POA: Diagnosis present

## 2019-11-06 DIAGNOSIS — Z9889 Other specified postprocedural states: Secondary | ICD-10-CM

## 2019-11-06 DIAGNOSIS — O9081 Anemia of the puerperium: Secondary | ICD-10-CM | POA: Diagnosis not present

## 2019-11-06 DIAGNOSIS — Z3A37 37 weeks gestation of pregnancy: Secondary | ICD-10-CM

## 2019-11-06 DIAGNOSIS — O34211 Maternal care for low transverse scar from previous cesarean delivery: Secondary | ICD-10-CM | POA: Diagnosis not present

## 2019-11-06 DIAGNOSIS — Z141 Cystic fibrosis carrier: Secondary | ICD-10-CM

## 2019-11-06 DIAGNOSIS — O4202 Full-term premature rupture of membranes, onset of labor within 24 hours of rupture: Secondary | ICD-10-CM

## 2019-11-06 DIAGNOSIS — O26893 Other specified pregnancy related conditions, third trimester: Secondary | ICD-10-CM | POA: Diagnosis not present

## 2019-11-06 DIAGNOSIS — D563 Thalassemia minor: Secondary | ICD-10-CM

## 2019-11-06 DIAGNOSIS — D62 Acute posthemorrhagic anemia: Secondary | ICD-10-CM | POA: Diagnosis not present

## 2019-11-06 DIAGNOSIS — O4292 Full-term premature rupture of membranes, unspecified as to length of time between rupture and onset of labor: Secondary | ICD-10-CM | POA: Diagnosis not present

## 2019-11-06 DIAGNOSIS — D649 Anemia, unspecified: Secondary | ICD-10-CM | POA: Diagnosis not present

## 2019-11-06 DIAGNOSIS — O0933 Supervision of pregnancy with insufficient antenatal care, third trimester: Secondary | ICD-10-CM

## 2019-11-06 DIAGNOSIS — Z3A00 Weeks of gestation of pregnancy not specified: Secondary | ICD-10-CM | POA: Diagnosis not present

## 2019-11-06 LAB — URINALYSIS, ROUTINE W REFLEX MICROSCOPIC
Bilirubin Urine: NEGATIVE
Glucose, UA: NEGATIVE mg/dL
Hgb urine dipstick: NEGATIVE
Ketones, ur: NEGATIVE mg/dL
Leukocytes,Ua: NEGATIVE
Nitrite: NEGATIVE
Protein, ur: NEGATIVE mg/dL
Specific Gravity, Urine: 1.004 — ABNORMAL LOW (ref 1.005–1.030)
pH: 7 (ref 5.0–8.0)

## 2019-11-06 LAB — CBC
HCT: 36.3 % (ref 36.0–46.0)
Hemoglobin: 11.2 g/dL — ABNORMAL LOW (ref 12.0–15.0)
MCH: 27 pg (ref 26.0–34.0)
MCHC: 30.9 g/dL (ref 30.0–36.0)
MCV: 87.5 fL (ref 80.0–100.0)
Platelets: 285 10*3/uL (ref 150–400)
RBC: 4.15 MIL/uL (ref 3.87–5.11)
RDW: 13.4 % (ref 11.5–15.5)
WBC: 9.7 10*3/uL (ref 4.0–10.5)
nRBC: 0 % (ref 0.0–0.2)

## 2019-11-06 LAB — TYPE AND SCREEN
ABO/RH(D): O POS
Antibody Screen: NEGATIVE

## 2019-11-06 LAB — SARS CORONAVIRUS 2 BY RT PCR (HOSPITAL ORDER, PERFORMED IN ~~LOC~~ HOSPITAL LAB): SARS Coronavirus 2: POSITIVE — AB

## 2019-11-06 LAB — POCT FERN TEST: POCT Fern Test: NEGATIVE

## 2019-11-06 LAB — AMNISURE RUPTURE OF MEMBRANE (ROM) NOT AT ARMC: Amnisure ROM: POSITIVE

## 2019-11-06 SURGERY — Surgical Case
Anesthesia: Spinal | Wound class: Clean Contaminated

## 2019-11-06 MED ORDER — OXYTOCIN-SODIUM CHLORIDE 30-0.9 UT/500ML-% IV SOLN
INTRAVENOUS | Status: AC
  Filled 2019-11-06: qty 500

## 2019-11-06 MED ORDER — LACTATED RINGERS IV SOLN: INTRAVENOUS | Status: DC

## 2019-11-06 MED ORDER — SENNOSIDES-DOCUSATE SODIUM 8.6-50 MG PO TABS
2.0000 | ORAL_TABLET | ORAL | Status: DC
  Administered 2019-11-07: 2 via ORAL
  Filled 2019-11-06: qty 2

## 2019-11-06 MED ORDER — KETOROLAC TROMETHAMINE 30 MG/ML IJ SOLN
INTRAMUSCULAR | Status: AC
  Filled 2019-11-06: qty 1

## 2019-11-06 MED ORDER — SOD CITRATE-CITRIC ACID 500-334 MG/5ML PO SOLN
30.0000 mL | Freq: Once | ORAL | Status: AC
  Administered 2019-11-06: 30 mL via ORAL
  Filled 2019-11-06: qty 30

## 2019-11-06 MED ORDER — MORPHINE SULFATE (PF) 0.5 MG/ML IJ SOLN
INTRAMUSCULAR | Status: AC
Start: 2019-11-06 — End: ?
  Filled 2019-11-06: qty 10

## 2019-11-06 MED ORDER — ONDANSETRON HCL 4 MG/2ML IJ SOLN
INTRAMUSCULAR | Status: AC
  Filled 2019-11-06: qty 2

## 2019-11-06 MED ORDER — PHENYLEPHRINE HCL-NACL 20-0.9 MG/250ML-% IV SOLN
INTRAVENOUS | Status: AC
  Filled 2019-11-06: qty 250

## 2019-11-06 MED ORDER — FENTANYL CITRATE (PF) 100 MCG/2ML IJ SOLN
INTRAMUSCULAR | Status: AC
  Filled 2019-11-06: qty 2

## 2019-11-06 MED ORDER — SIMETHICONE 80 MG PO CHEW
80.0000 mg | CHEWABLE_TABLET | Freq: Three times a day (TID) | ORAL | Status: DC
  Administered 2019-11-07 – 2019-11-08 (×5): 80 mg via ORAL
  Filled 2019-11-06 (×5): qty 1

## 2019-11-06 MED ORDER — CHLOROPROCAINE HCL (PF) 3 % IJ SOLN
INTRAMUSCULAR | Status: AC
  Filled 2019-11-06: qty 20

## 2019-11-06 MED ORDER — WITCH HAZEL-GLYCERIN EX PADS: 1.0000 "application " | MEDICATED_PAD | CUTANEOUS | Status: DC | PRN

## 2019-11-06 MED ORDER — FENTANYL CITRATE (PF) 100 MCG/2ML IJ SOLN
INTRAMUSCULAR | Status: DC | PRN
Start: 2019-11-06 — End: 2019-11-06
  Administered 2019-11-06: 85 ug via INTRAVENOUS
  Administered 2019-11-06 (×2): 25 ug via INTRAVENOUS

## 2019-11-06 MED ORDER — OXYTOCIN-SODIUM CHLORIDE 30-0.9 UT/500ML-% IV SOLN
INTRAVENOUS | Status: DC | PRN
  Administered 2019-11-06 (×2): 30 [IU] via INTRAVENOUS

## 2019-11-06 MED ORDER — ENOXAPARIN SODIUM 40 MG/0.4ML ~~LOC~~ SOLN
40.0000 mg | SUBCUTANEOUS | Status: DC
  Administered 2019-11-07 – 2019-11-08 (×2): 40 mg via SUBCUTANEOUS
  Filled 2019-11-06 (×2): qty 0.4

## 2019-11-06 MED ORDER — IBUPROFEN 800 MG PO TABS
800.0000 mg | ORAL_TABLET | Freq: Three times a day (TID) | ORAL | Status: DC
  Administered 2019-11-07 – 2019-11-08 (×3): 800 mg via ORAL
  Filled 2019-11-06 (×3): qty 1

## 2019-11-06 MED ORDER — CEFAZOLIN SODIUM-DEXTROSE 2-4 GM/100ML-% IV SOLN
2.0000 g | INTRAVENOUS | Status: DC
  Filled 2019-11-06: qty 100

## 2019-11-06 MED ORDER — LEVONORGESTREL 19.5 MCG/DAY IU IUD
INTRAUTERINE_SYSTEM | Freq: Once | INTRAUTERINE | Status: AC
  Administered 2019-11-06: 18:00:00 1 via INTRAUTERINE

## 2019-11-06 MED ORDER — ACETAMINOPHEN 500 MG PO TABS: 1000.0000 mg | ORAL_TABLET | ORAL | Status: DC

## 2019-11-06 MED ORDER — SIMETHICONE 80 MG PO CHEW
80.0000 mg | CHEWABLE_TABLET | ORAL | Status: DC
  Administered 2019-11-07: 80 mg via ORAL
  Filled 2019-11-06: qty 1

## 2019-11-06 MED ORDER — KETOROLAC TROMETHAMINE 30 MG/ML IJ SOLN
INTRAMUSCULAR | Status: DC | PRN
  Administered 2019-11-06: 30 mg via INTRAVENOUS

## 2019-11-06 MED ORDER — BUPIVACAINE IN DEXTROSE 0.75-8.25 % IT SOLN
INTRATHECAL | Status: DC | PRN
  Administered 2019-11-06: 1.4 mg via INTRATHECAL

## 2019-11-06 MED ORDER — COCONUT OIL OIL: 1.0000 "application " | TOPICAL_OIL | Status: DC | PRN

## 2019-11-06 MED ORDER — LACTATED RINGERS IV SOLN: INTRAVENOUS | Status: DC | PRN

## 2019-11-06 MED ORDER — MORPHINE SULFATE (PF) 2 MG/ML IV SOLN: 1.0000 mg | INTRAVENOUS | Status: DC | PRN

## 2019-11-06 MED ORDER — OXYTOCIN-SODIUM CHLORIDE 30-0.9 UT/500ML-% IV SOLN
INTRAVENOUS | Status: DC | PRN
  Administered 2019-11-06: 30 [IU] via INTRAVENOUS

## 2019-11-06 MED ORDER — DIPHENHYDRAMINE HCL 25 MG PO CAPS: 25.0000 mg | ORAL_CAPSULE | Freq: Four times a day (QID) | ORAL | Status: DC | PRN

## 2019-11-06 MED ORDER — POVIDONE-IODINE 10 % EX SWAB: 2.0000 "application " | Freq: Once | CUTANEOUS | Status: DC

## 2019-11-06 MED ORDER — LACTATED RINGERS IV BOLUS
1000.0000 mL | Freq: Once | INTRAVENOUS | Status: AC
  Administered 2019-11-06: 1000 mL via INTRAVENOUS

## 2019-11-06 MED ORDER — PHENYLEPHRINE HCL-NACL 20-0.9 MG/250ML-% IV SOLN
INTRAVENOUS | Status: DC | PRN
  Administered 2019-11-06: 60 ug/min via INTRAVENOUS

## 2019-11-06 MED ORDER — OXYCODONE-ACETAMINOPHEN 5-325 MG PO TABS
2.0000 | ORAL_TABLET | ORAL | Status: DC | PRN
  Administered 2019-11-07 – 2019-11-08 (×4): 2 via ORAL
  Filled 2019-11-06 (×5): qty 2

## 2019-11-06 MED ORDER — SODIUM CHLORIDE 0.9 % IV SOLN: INTRAVENOUS | Status: DC | PRN

## 2019-11-06 MED ORDER — LEVONORGESTREL 19.5 MCG/DAY IU IUD
INTRAUTERINE_SYSTEM | INTRAUTERINE | Status: AC
  Filled 2019-11-06: qty 1

## 2019-11-06 MED ORDER — PRENATAL MULTIVITAMIN CH
1.0000 | ORAL_TABLET | Freq: Every day | ORAL | Status: DC
  Administered 2019-11-07 – 2019-11-08 (×2): 1 via ORAL
  Filled 2019-11-06 (×2): qty 1

## 2019-11-06 MED ORDER — CHLOROPROCAINE HCL (PF) 3 % IJ SOLN
INTRAMUSCULAR | Status: DC | PRN
  Administered 2019-11-06: 20 mL via EPIDURAL

## 2019-11-06 MED ORDER — ONDANSETRON HCL 4 MG/2ML IJ SOLN
INTRAMUSCULAR | Status: DC | PRN
  Administered 2019-11-06: 4 mg via INTRAVENOUS

## 2019-11-06 MED ORDER — KETOROLAC TROMETHAMINE 30 MG/ML IJ SOLN: 15.0000 mg | INTRAMUSCULAR | Status: DC

## 2019-11-06 MED ORDER — ONDANSETRON HCL 4 MG/2ML IJ SOLN
4.0000 mg | Freq: Three times a day (TID) | INTRAMUSCULAR | Status: DC | PRN
  Administered 2019-11-06: 4 mg via INTRAVENOUS
  Filled 2019-11-06: qty 2

## 2019-11-06 MED ORDER — SIMETHICONE 80 MG PO CHEW: 80.0000 mg | CHEWABLE_TABLET | ORAL | Status: DC | PRN

## 2019-11-06 MED ORDER — MORPHINE SULFATE (PF) 0.5 MG/ML IJ SOLN
INTRAMUSCULAR | Status: DC | PRN
Start: 2019-11-06 — End: 2019-11-06
  Administered 2019-11-06: .15 mg via INTRATHECAL

## 2019-11-06 MED ORDER — KETOROLAC TROMETHAMINE 30 MG/ML IJ SOLN
30.0000 mg | Freq: Four times a day (QID) | INTRAMUSCULAR | Status: AC
  Administered 2019-11-06 – 2019-11-07 (×4): 30 mg via INTRAVENOUS
  Filled 2019-11-06 (×4): qty 1

## 2019-11-06 MED ORDER — ZOLPIDEM TARTRATE 5 MG PO TABS: 5.0000 mg | ORAL_TABLET | Freq: Every evening | ORAL | Status: DC | PRN

## 2019-11-06 MED ORDER — CEFAZOLIN SODIUM-DEXTROSE 2-4 GM/100ML-% IV SOLN
INTRAVENOUS | Status: AC
  Filled 2019-11-06: qty 100

## 2019-11-06 MED ORDER — FENTANYL CITRATE (PF) 100 MCG/2ML IJ SOLN
INTRAMUSCULAR | Status: DC | PRN
  Administered 2019-11-06: 15 ug via INTRATHECAL

## 2019-11-06 MED ORDER — TETANUS-DIPHTH-ACELL PERTUSSIS 5-2.5-18.5 LF-MCG/0.5 IM SUSP: 0.5000 mL | Freq: Once | INTRAMUSCULAR | Status: DC

## 2019-11-06 MED ORDER — OXYCODONE-ACETAMINOPHEN 5-325 MG PO TABS: 1.0000 | ORAL_TABLET | ORAL | Status: DC | PRN

## 2019-11-06 MED ORDER — OXYTOCIN-SODIUM CHLORIDE 30-0.9 UT/500ML-% IV SOLN
2.5000 [IU]/h | INTRAVENOUS | Status: AC
  Administered 2019-11-07: 2.5 [IU]/h via INTRAVENOUS
  Filled 2019-11-06: qty 500

## 2019-11-06 MED ORDER — CEFAZOLIN SODIUM-DEXTROSE 2-4 GM/100ML-% IV SOLN: 2.0000 g | INTRAVENOUS | Status: DC

## 2019-11-06 MED ORDER — DIBUCAINE (PERIANAL) 1 % EX OINT: 1.0000 "application " | TOPICAL_OINTMENT | CUTANEOUS | Status: DC | PRN

## 2019-11-06 MED ORDER — SOD CITRATE-CITRIC ACID 500-334 MG/5ML PO SOLN: 30.0000 mL | ORAL | Status: DC

## 2019-11-06 MED ORDER — MORPHINE SULFATE (PF) 2 MG/ML IV SOLN
1.0000 mg | INTRAVENOUS | Status: DC | PRN
  Administered 2019-11-06: 2 mg via INTRAVENOUS
  Filled 2019-11-06: qty 1

## 2019-11-06 MED ORDER — MENTHOL 3 MG MT LOZG: 1.0000 | LOZENGE | OROMUCOSAL | Status: DC | PRN

## 2019-11-06 MED ORDER — FAMOTIDINE IN NACL 20-0.9 MG/50ML-% IV SOLN
20.0000 mg | Freq: Once | INTRAVENOUS | Status: AC
  Administered 2019-11-06: 20 mg via INTRAVENOUS
  Filled 2019-11-06: qty 50

## 2019-11-06 SURGICAL SUPPLY — 34 items
BENZOIN TINCTURE PRP APPL 2/3 (GAUZE/BANDAGES/DRESSINGS) ×2 IMPLANT
CHLORAPREP W/TINT 26 (MISCELLANEOUS) ×2 IMPLANT
CLAMP CORD UMBIL (MISCELLANEOUS) ×2 IMPLANT
CLOSURE STERI-STRIP 1/4X4 (GAUZE/BANDAGES/DRESSINGS) ×2 IMPLANT
CLOTH BEACON ORANGE TIMEOUT ST (SAFETY) ×2 IMPLANT
DRSG OPSITE POSTOP 4X10 (GAUZE/BANDAGES/DRESSINGS) ×2 IMPLANT
ELECT REM PT RETURN 9FT ADLT (ELECTROSURGICAL) ×2
ELECTRODE REM PT RTRN 9FT ADLT (ELECTROSURGICAL) ×1 IMPLANT
EXTRACTOR VACUUM KIWI (MISCELLANEOUS) IMPLANT
EXTRACTOR VACUUM M CUP 4 TUBE (SUCTIONS) IMPLANT
GLOVE BIO SURGEON STRL SZ7.5 (GLOVE) ×2 IMPLANT
GLOVE BIOGEL PI IND STRL 7.0 (GLOVE) ×2 IMPLANT
GLOVE BIOGEL PI INDICATOR 7.0 (GLOVE) ×2
GOWN STRL REUS W/TWL 2XL LVL3 (GOWN DISPOSABLE) ×2 IMPLANT
GOWN STRL REUS W/TWL LRG LVL3 (GOWN DISPOSABLE) ×4 IMPLANT
HEMOSTAT ARISTA ABSORB 3G PWDR (HEMOSTASIS) IMPLANT
KIT ABG SYR 3ML LUER SLIP (SYRINGE) IMPLANT
NEEDLE HYPO 25X5/8 SAFETYGLIDE (NEEDLE) IMPLANT
NS IRRIG 1000ML POUR BTL (IV SOLUTION) ×2 IMPLANT
PACK C SECTION WH (CUSTOM PROCEDURE TRAY) ×2 IMPLANT
PAD OB MATERNITY 4.3X12.25 (PERSONAL CARE ITEMS) ×2 IMPLANT
RTRCTR C-SECT PINK 25CM LRG (MISCELLANEOUS) ×2 IMPLANT
STRIP CLOSURE SKIN 1/2X4 (GAUZE/BANDAGES/DRESSINGS) IMPLANT
SUT PDS AB 0 CTX 36 PDP370T (SUTURE) IMPLANT
SUT PLAIN 0 NONE (SUTURE) IMPLANT
SUT PLAIN 2 0 (SUTURE)
SUT PLAIN ABS 2-0 CT1 27XMFL (SUTURE) IMPLANT
SUT VIC AB 0 CT1 36 (SUTURE) ×4 IMPLANT
SUT VIC AB 2-0 CT1 27 (SUTURE) ×2
SUT VIC AB 2-0 CT1 TAPERPNT 27 (SUTURE) ×2 IMPLANT
SUT VIC AB 4-0 KS 27 (SUTURE) ×4 IMPLANT
TOWEL OR 17X24 6PK STRL BLUE (TOWEL DISPOSABLE) ×4 IMPLANT
TRAY FOLEY W/BAG SLVR 14FR LF (SET/KITS/TRAYS/PACK) IMPLANT
WATER STERILE IRR 1000ML POUR (IV SOLUTION) ×2 IMPLANT

## 2019-11-06 NOTE — Anesthesia Preprocedure Evaluation (Addendum)
Anesthesia Evaluation  Patient identified by MRN, date of birth, ID band Patient awake    Reviewed: Allergy & Precautions, H&P , NPO status , Patient's Chart, lab work & pertinent test results  Airway Mallampati: II  TM Distance: >3 FB Neck ROM: Full    Dental no notable dental hx. (+) Dental Advisory Given   Pulmonary neg pulmonary ROS,    Pulmonary exam normal breath sounds clear to auscultation       Cardiovascular negative cardio ROS Normal cardiovascular exam Rhythm:Regular Rate:Normal     Neuro/Psych  Headaches, negative psych ROS   GI/Hepatic negative GI ROS, Neg liver ROS,   Endo/Other  negative endocrine ROS  Renal/GU negative Renal ROS     Musculoskeletal   Abdominal   Peds  Hematology  (+) Blood dyscrasia, anemia ,   Anesthesia Other Findings   Reproductive/Obstetrics (+) Pregnancy                            Anesthesia Physical  Anesthesia Plan  ASA: II  Anesthesia Plan: Spinal   Post-op Pain Management:    Induction:   PONV Risk Score and Plan: 3 and Ondansetron, Dexamethasone and Scopolamine patch - Pre-op  Airway Management Planned: Natural Airway  Additional Equipment:   Intra-op Plan:   Post-operative Plan:   Informed Consent: I have reviewed the patients History and Physical, chart, labs and discussed the procedure including the risks, benefits and alternatives for the proposed anesthesia with the patient or authorized representative who has indicated his/her understanding and acceptance.       Plan Discussed with: CRNA  Anesthesia Plan Comments:        Anesthesia Quick Evaluation

## 2019-11-06 NOTE — Anesthesia Postprocedure Evaluation (Signed)
Anesthesia Post Note  Patient: Annette Rodriguez  Procedure(s) Performed: CESAREAN SECTION (N/A )     Patient location during evaluation: Other (OR) Anesthesia Type: Spinal Level of consciousness: awake and alert Pain management: pain level controlled Vital Signs Assessment: post-procedure vital signs reviewed and stable Respiratory status: spontaneous breathing, nonlabored ventilation and respiratory function stable Cardiovascular status: stable Postop Assessment: no headache, no backache and epidural receding Anesthetic complications: no   No complications documented.  Last Vitals:  Vitals:   11/06/19 2115 11/06/19 2215  BP: 114/71 119/76  Pulse: 90 61  Resp: 18 18  Temp: 36.6 C   SpO2: 100% 100%    Last Pain:  Vitals:   11/06/19 2215  TempSrc:   PainSc: 6    Pain Goal:                   Lewie Loron

## 2019-11-06 NOTE — H&P (Signed)
Annette Rodriguez is a 57 y.K.[id number] IUP 37 4/7 weeks female presenting for uterine and SROM. Pt reports + FM. Prenatal care limited at [facility].   OB History    Gravida  3   Para  2   Term  1   Preterm  1   AB      Living  2     SAB      TAB      Ectopic      Multiple      Live Births  2          Past Medical History:  Diagnosis Date  . Anemia   . Anemia affecting pregnancy in third trimester 04/29/2017   H 9.6 04/29/17  . Cystic fibrosis carrier   . Headache   . Insufficient prenatal care 04/23/2017   NOB visit at 31 weeks per patient.  S<D.    . Post-operative state 06/07/2017  . Supervision of high risk pregnancy, antepartum 04/23/2017    Clinic  [facility]  Prenatal Labs Dating LMP  Blood type: O/Positive/-- (02/20 1347)  Genetic Screen  AFP:          NIPS:Panorama: Neg Antibody:Negative (02/20 1347) Anatomic [place]  Rubella: 3.02 (02/20 1347) GTT   Third trimester: WNL RPR: Non Reactive (02/26 1108)  Flu vaccine Declined 04/23/17 HBsAg: Negative (02/20 1347)  TDaP vaccine 04/23/17                                  Rhogam:n/a O+ HIV: Non Re  . Vaginal Pap smear, abnormal   . Vitamin D deficiency 04/25/2017   Past Surgical History:  Procedure Laterality Date  . CESAREAN SECTION  05/11/2015  . CESAREAN SECTION N/A 06/07/2017   Procedure: CESAREAN SECTION;  Surgeon: Hermina Staggers, MD;  Location: [facility] BIRTHING SUITES;  Service: Obstetrics;  Laterality: N/A;   Family History: family history includes Asthma in her maternal grandmother. Social History:  reports that she has never smoked. She has never used smokeless tobacco. She reports that she does not drink alcohol and does not use drugs.     Maternal Diabetes: No Genetic Screening: Normal Maternal Ultrasounds/Referrals: Normal Fetal Ultrasounds or other Referrals:  None Maternal Substance Abuse:  No Significant Maternal Medications:  None Significant Maternal Lab Results:  None Other Comments:  None  Review of Systems   Constitutional: Negative.   Respiratory: Negative.   Cardiovascular: Negative.   Gastrointestinal: Negative.   Genitourinary: Negative.    History Dilation: 2.5 Effacement (%): 80 Station: -2, -1 Exam by:: Janeth Rase RN Blood pressure 115/78, pulse 87, temperature 98.3 F (36.8 C), temperature source Oral, resp. rate 16, height 4\' 11"  (1.499 m), weight 50.2 kg, last menstrual period 02/16/2019, SpO2 100 %, currently breastfeeding. Exam Physical Exam Constitutional:      Appearance: Normal appearance.  Cardiovascular:     Rate and Rhythm: Normal rate and regular rhythm.     Heart sounds: Normal heart sounds.  Pulmonary:     Effort: Pulmonary effort is normal.     Breath sounds: Normal breath sounds.  Abdominal:     General: Bowel sounds are normal.     Palpations: Abdomen is soft.  Genitourinary:    Comments: deffered Musculoskeletal:     Cervical back: Normal range of motion and neck supple.  Neurological:     Mental Status: She is alert.     Prenatal labs: ABO, Rh: --/--/O POS (  08/15 2015) Antibody: NEG (08/15 2015) Rubella: 6.41 (03/08 1457) RPR: NON REACTIVE (08/15 2015)  HBsAg: Negative (03/08 1457)  HIV: Non Reactive (08/05 1624)  GBS: Positive/-- (08/05 0411)   Assessment/Plan: IUP 37 4/7 weeks Prior c section x 2 SROM Limited prenatal care  Pt for repeat c section. COVID test in process. Will proceed toward csection once COVID results return. RLTCS reviewed with pt. R/B/Post op care reviewed. Pt verbalized understanding. Nursing, OR and anesthesia aware.   The risks of cesarean section discussed with the patient included but were not limited to: bleeding which may require transfusion or reoperation; infection which may require antibiotics; injury to bowel, bladder, ureters or other surrounding organs; injury to the fetus; need for additional procedures including hysterectomy in the event of a life-threatening hemorrhage; placental abnormalities wth  subsequent pregnancies, incisional problems, thromboembolic phenomenon and other postoperative/anesthesia complications. The patient concurred with the proposed plan, giving informed written consent for the procedure.   Anesthesia and OR aware. Preoperative prophylactic antibiotics and SCDs ordered on call to the OR.  To OR when ready.       Hermina Staggers 11/06/2019, 11:10 AM

## 2019-11-06 NOTE — MAU Note (Signed)
Annette Rodriguez is a 24 y.o. at [redacted]w[redacted]d here in MAU reporting: contractions since yesterday morning, they have gotten more regular. Is having pain when trying to urinate or have a BM. Was leaking clear fluid all day yesterday. No bleeding.   Onset of complaint: yesterday  Pain score: 9/10  Vitals:   11/06/19 0831  BP: 115/78  Pulse: 87  Resp: 16  Temp: 98.3 F (36.8 C)  SpO2: 100%     FHT: +FM  Lab orders placed from triage: UA

## 2019-11-06 NOTE — Discharge Summary (Signed)
Postpartum Discharge Summary  Date of Service updated 11/08/19     Patient Name: Annette Rodriguez DOB: [birth year] MRN: [id number]  Date of admission: 11/06/2019 Delivery date:11/06/2019  Delivering provider: Chancy Milroy  Date of discharge: 11/08/2019  Admitting diagnosis: Post-operative state [Z98.890] Intrauterine pregnancy: [redacted]w[redacted]d    Secondary diagnosis:  Active Problems:   History of cesarean section   Encounter for insertion of intrauterine contraceptive device (IUD)   COVID-19   Post-operative state  Additional problems: PROM    Discharge diagnosis: Term Pregnancy Delivered, Anemia and Covid 19                                              Post partum procedures:Liletta IUD Augmentation:  Complications: None  Hospital course: Induction of Labor With Cesarean Section   24y.o. yo [id number] [id number] admitted to the hospital 11/06/2019 for induction of labor. Patient had a labor course significant for repeat LTCS and SROM. The patient went for cesarean section due to Elective Repeat. Delivery details are as follows: Membrane Rupture Time/Date: 5:00 PM ,11/03/2019   Delivery Method:C-Section, Low Transverse  Details of operation can be found in separate operative Note.  Patient had an uncomplicated postpartum course. She is ambulating, tolerating a regular diet, passing flatus, and urinating well.  Patient is discharged home in stable condition on 11/08/19.      Newborn Data: Birth date:11/06/2019  Birth time:5:28 PM  Gender:Female  Living status:Living  Apgars:7 ,9  Weight:2835 g                                 Magnesium Sulfate received: No BMZ received: Yes Rhophylac:N/A MMR:N/A T-DaP:Given prenatally Flu: N/A Transfusion:No  Physical exam  Vitals:   11/07/19 1132 11/07/19 1540 11/07/19 2010 11/08/19 0530  BP: 118/65 112/62 119/79 111/76  Pulse: 67 65 65 67  Resp: _0 Temp: 98 F (36.7 C) 98.2 F (36.8 C) 98.3 F (36.8 C) 98 F (36.7 C)  TempSrc:  Oral Oral Oral Oral  SpO2: 100% 100% 100% 100%  Weight:      Height:       General: alert, cooperative and no distress Lochia: appropriate Uterine Fundus: firm Incision: Dressing is clean, dry, and intact DVT Evaluation: No evidence of DVT seen on physical exam. Labs: Lab Results  Component Value Date   WBC 9.3 11/07/2019   HGB 7.8 (L) 11/07/2019   HCT 24.4 (L) 11/07/2019   MCV 85.3 11/07/2019   PLT 193 11/07/2019   CMP Latest Ref Rng & Units 04/01/2019  Glucose 70 - 99 mg/dL 95  BUN 6 - 20 mg/dL 12  Creatinine 0.44 - 1.00 mg/dL 0.71  Sodium 135 - 145 mmol/L 137  Potassium 3.5 - 5.1 mmol/L 3.4(L)  Chloride 98 - 111 mmol/L 104  CO2 22 - 32 mmol/L 21(L)  Calcium 8.9 - 10.3 mg/dL 9.7  Total Protein 6.5 - 8.1 g/dL 7.6  Total Bilirubin 0.3 - 1.2 mg/dL 0.9  Alkaline Phos 38 - 126 U/L 49  AST 15 - 41 U/L 20  ALT 0 - 44 U/L 18   Edinburgh Score: Edinburgh Postnatal Depression Scale Screening Tool 11/07/2019  I have been able to laugh and see the funny side of things. 0  I have looked  forward with enjoyment to things. 0  I have blamed myself unnecessarily when things went wrong. 0  I have been anxious or worried for no good reason. 0  I have felt scared or panicky for no good reason. 0  Things have been getting on top of me. 0  I have been so unhappy that I have had difficulty sleeping. 0  I have felt sad or miserable. 0  I have been so unhappy that I have been crying. 0  The thought of harming myself has occurred to me. 0  Edinburgh Postnatal Depression Scale Total 0     After visit meds:  Allergies as of 11/08/2019   No Known Allergies     Medication List    TAKE these medications   B-6 PO Take by mouth.   ibuprofen 800 MG tablet Commonly known as: ADVIL Take 1 tablet (800 mg total) by mouth 3 (three) times daily.   Iron 325 (65 Fe) MG Tabs Take 1 tablet (325 mg total) by mouth 2 (two) times daily with a meal. Take with breakfast and dinner, every other day.    Vitafol Gummies 3.33-0.333-34.8 MG Chew Chew 3 tablets by mouth daily before breakfast.        Discharge home in stable condition  Infant Feeding: routine diet Infant Disposition:home with mother Discharge instruction: per After Visit Summary and Postpartum booklet. Activity: Advance as tolerated. Pelvic rest for 6 weeks.  Diet: routine diet Future Appointments: Future Appointments  Date Time Provider [place]  11/12/2019  8:45 AM Griffin Basil, MD [facility] None  11/12/2019  9:00 AM [id number] None   Follow up Visit:   Please schedule this patient for a In person postpartum visit in 4 weeks with the following provider: Any provider. Additional Postpartum F/U:Incision check 2 weeks  Low risk pregnancy complicated by: Covid dx 9/4 Delivery mode:  C-Section, Low Transverse  Anticipated Birth Control:  PP IUD placed pp RX: Iron   Hamish Banks, Artist Pais, NP 11/08/2019 9:29 AM

## 2019-11-06 NOTE — Anesthesia Procedure Notes (Addendum)
Spinal  Patient location during procedure: OR Start time: 11/06/2019 5:00 PM End time: 11/06/2019 5:05 PM Staffing Performed: anesthesiologist  Anesthesiologist: Nolon Nations, MD Preanesthetic Checklist Completed: patient identified, IV checked, site marked, risks and benefits discussed, surgical consent, monitors and equipment checked, pre-op evaluation and timeout performed Spinal Block Patient position: sitting Prep: DuraPrep and site prepped and draped Patient monitoring: heart rate, continuous pulse ox and blood pressure Approach: midline Location: L3-4 Injection technique: single-shot Needle Needle type: Sprotte  Needle gauge: 24 G Needle length: 9 cm Additional Notes Expiration date of kit checked and confirmed. Patient tolerated procedure well, without complications.

## 2019-11-06 NOTE — Transfer of Care (Signed)
Immediate Anesthesia Transfer of Care Note  Patient: Annette Rodriguez  Procedure(s) Performed: CESAREAN SECTION (N/A )  Patient Location: PACU  Anesthesia Type:Spinal  Level of Consciousness: awake and alert   Airway & Oxygen Therapy: Patient Spontanous Breathing  Post-op Assessment: Report given to RN and Post -op Vital signs reviewed and stable  Post vital signs: Reviewed  Last Vitals:  Vitals Value Taken Time  BP    Temp    Pulse 61 11/06/19 1817  Resp 18 11/06/19 1817  SpO2 100 % 11/06/19 1817  Vitals shown include unvalidated device data.  Last Pain:  Vitals:   11/06/19 1600  TempSrc: Oral  PainSc:          Complications: No complications documented.

## 2019-11-06 NOTE — Op Note (Signed)
Cesarean Section Procedure Note  11/06/2019  6:03 PM  PATIENT:  Annette Rodriguez  24 y.o. female  PRE-OPERATIVE DIAGNOSIS:  Repeat LTCS and SROM, IUD and Asx COVID  POST-OPERATIVE DIAGNOSIS:  SAA plus pos tplacenta IUD placement  PROCEDURE:  Procedure(s): CESAREAN SECTION (N/A) with post placenta IUD placement  SURGEON:  Surgeon(s) and Role:    * Malachy Chamber, MD - Primary    * Goswick, Skipper Cliche, MD - Assisting    * Hermina Staggers, MD  ASSISTANTS: none   ANESTHESIA:   spinal  EBL:  Total I/O In: 1200 [I.V.:1200] Out: 282 [Urine:50; Blood:232]  BLOOD ADMINISTERED:none  DRAINS: none   LOCAL MEDICATIONS USED:  NONE  SPECIMEN:  No Specimen  DISPOSITION OF SPECIMEN:  N/A   Procedure Details   The patient was seen in the Holding Room. The risks, benefits, complications, treatment options, and expected outcomes were discussed with the patient.  The patient concurred with the proposed plan, giving informed consent.  The site of surgery properly noted/marked. The patient was taken to Operating Room # A, identified as Annette Rodriguez and the procedure verified as C-Section Delivery. A Time Out was held and the above information confirmed.  After induction of anesthesia, the patient was draped and prepped in the usual sterile manner. A Pfannenstiel incision was made and carried down through the subcutaneous tissue to the fascia. Fascial incision was made and extended transversely. The fascia was separated from the underlying rectus tissue superiorly and inferiorly. The peritoneum was identified and entered. Peritoneal incision was extended longitudinally. The utero-vesical peritoneal reflection was incised transversely and the bladder flap was bluntly freed from the lower uterine segment. A low transverse uterine incision was made. Delivered a viable female infant with Apgars and weight as recorded. After the umbilical cord was clamped and cut cord blood was obtained for evaluation.  The placenta was removed intact and appeared normal. The uterine outline, tubes and ovaries appeared normal. Liletta IUD was then placed without problems.  The uterine incision was closed with running locked sutures of Chromic x 2. Hemostasis was observed. Lavage was carried out until clear. The fascia was then reapproximated with running sutures of Vicryl. The skin was reapproximated with Vicryl.  Instrument, sponge, and needle counts were correct prior the abdominal closure and at the conclusion of the case.   Complications:  None; patient tolerated the procedure well.  COUNTS:  YES  PLAN OF CARE: Admit to inpatient   PATIENT DISPOSITION:  PACU - hemodynamically stable.   Delay start of Pharmacological VTE agent (>24hrs) due to surgical blood loss or risk of bleeding: not applicable             Disposition: PACU - hemodynamically stable.         Condition: stable   Hermina Staggers, MD 11/06/2019 6:03 PM

## 2019-11-07 LAB — CBC
HCT: 24.4 % — ABNORMAL LOW (ref 36.0–46.0)
Hemoglobin: 7.8 g/dL — ABNORMAL LOW (ref 12.0–15.0)
MCH: 27.3 pg (ref 26.0–34.0)
MCHC: 32 g/dL (ref 30.0–36.0)
MCV: 85.3 fL (ref 80.0–100.0)
Platelets: 193 10*3/uL (ref 150–400)
RBC: 2.86 MIL/uL — ABNORMAL LOW (ref 3.87–5.11)
RDW: 13.3 % (ref 11.5–15.5)
WBC: 9.3 10*3/uL (ref 4.0–10.5)
nRBC: 0 % (ref 0.0–0.2)

## 2019-11-07 LAB — RPR: RPR Ser Ql: NONREACTIVE

## 2019-11-07 MED ORDER — PROMETHAZINE HCL 25 MG/ML IJ SOLN
25.0000 mg | Freq: Four times a day (QID) | INTRAMUSCULAR | Status: DC | PRN
  Administered 2019-11-07: 25 mg via INTRAVENOUS
  Filled 2019-11-07: qty 1

## 2019-11-07 MED ORDER — SCOPOLAMINE 1 MG/3DAYS TD PT72
1.0000 | MEDICATED_PATCH | TRANSDERMAL | Status: DC
  Administered 2019-11-07: 1.5 mg via TRANSDERMAL
  Filled 2019-11-07: qty 1

## 2019-11-07 NOTE — Progress Notes (Signed)
Subjective: Postpartum Day 1: Cesarean Delivery Patient reports tolerating PO and no problems voiding.  She reported cough earlier this morning but this has resolved now that n/v is improved. She has not needed treatment for cough. There are no other respiratory or GI symptoms c/w COVID.  Objective: Vital signs in last 24 hours: Temp:  [97.8 F (36.6 C)-98.4 F (36.9 C)] 97.9 F (36.6 C) (09/05 0806) Pulse Rate:  [56-95] 68 (09/05 0806) Resp:  [14-18] 18 (09/05 0806) BP: (96-126)/(63-84) 120/84 (09/05 0806) SpO2:  [98 %-100 %] 100 % (09/05 0806)  Physical Exam:  General: alert, cooperative and no distress Lochia: appropriate Uterine Fundus: firm Incision: lower 1/3 of honeycomb dressing saturated with bottom edge of tape compromised.  DVT Evaluation: No evidence of DVT seen on physical exam. Negative Homan's sign. No cords or calf tenderness. No significant calf/ankle edema.  Recent Labs    11/06/19 1120 11/07/19 0526  HGB 11.2* 7.8*  HCT 36.3 24.4*    Assessment/Plan: Status post Cesarean section. Doing well postoperatively.  COVID positive asymptomatic Continue current care.  Sharen Counter 11/07/2019, 9:47 AM

## 2019-11-08 DIAGNOSIS — Z87891 Personal history of nicotine dependence: Secondary | ICD-10-CM

## 2019-11-08 DIAGNOSIS — O9853 Other viral diseases complicating the puerperium: Secondary | ICD-10-CM

## 2019-11-08 MED ORDER — IRON 325 (65 FE) MG PO TABS: 1.0000 | ORAL_TABLET | Freq: Two times a day (BID) | ORAL | 0 refills | Status: DC

## 2019-11-08 MED ORDER — OXYCODONE-ACETAMINOPHEN 5-325 MG PO TABS: 1.0000 | ORAL_TABLET | Freq: Four times a day (QID) | ORAL | 0 refills | Status: DC | PRN

## 2019-11-08 MED ORDER — IBUPROFEN 800 MG PO TABS: 800.0000 mg | ORAL_TABLET | Freq: Three times a day (TID) | ORAL | 0 refills | Status: DC

## 2019-11-08 NOTE — Discharge Instructions (Signed)
COVID-19 COVID-19 is a respiratory infection that is caused by a virus called severe acute respiratory syndrome coronavirus 2 (SARS-CoV-2). The disease is also known as coronavirus disease or novel coronavirus. In some people, the virus may not cause any symptoms. In others, it may cause a serious infection. The infection can get worse quickly and can lead to complications, such as:  Pneumonia, or infection of the lungs.  Acute respiratory distress syndrome or ARDS. This is a condition in which fluid build-up in the lungs prevents the lungs from filling with air and passing oxygen into the blood.  Acute respiratory failure. This is a condition in which there is not enough oxygen passing from the lungs to the body or when carbon dioxide is not passing from the lungs out of the body.  Sepsis or septic shock. This is a serious bodily reaction to an infection.  Blood clotting problems.  Secondary infections due to bacteria or fungus.  Organ failure. This is when your body's organs stop working. The virus that causes COVID-19 is contagious. This means that it can spread from person to person through droplets from coughs and sneezes (respiratory secretions). What are the causes? This illness is caused by a virus. You may catch the virus by:  Breathing in droplets from an infected person. Droplets can be spread by a person breathing, speaking, singing, coughing, or sneezing.  Touching something, like a table or a doorknob, that was exposed to the virus (contaminated) and then touching your mouth, nose, or eyes. What increases the risk? Risk for infection You are more likely to be infected with this virus if you:  Are within 6 feet (2 meters) of a person with COVID-19.  Provide care for or live with a person who is infected with COVID-19.  Spend time in crowded indoor spaces or live in shared housing. Risk for serious illness You are more likely to become seriously ill from the virus if  you:  Are 51 years of age or older. The higher your age, the more you are at risk for serious illness.  Live in a nursing home or long-term care facility.  Have cancer.  Have a long-term (chronic) disease such as: ? Chronic lung disease, including chronic obstructive pulmonary disease or asthma. ? A long-term disease that lowers your body's ability to fight infection (immunocompromised). ? Heart disease, including heart failure, a condition in which the arteries that lead to the heart become narrow or blocked (coronary artery disease), a disease which makes the heart muscle thick, weak, or stiff (cardiomyopathy). ? Diabetes. ? Chronic kidney disease. ? Sickle cell disease, a condition in which red blood cells have an abnormal "sickle" shape. ? Liver disease.  Are obese. What are the signs or symptoms? Symptoms of this condition can range from mild to severe. Symptoms may appear any time from 2 to 14 days after being exposed to the virus. They include:  A fever or chills.  A cough.  Difficulty breathing.  Headaches, body aches, or muscle aches.  Runny or stuffy (congested) nose.  A sore throat.  New loss of taste or smell. Some people may also have stomach problems, such as nausea, vomiting, or diarrhea. Other people may not have any symptoms of COVID-19. How is this diagnosed? This condition may be diagnosed based on:  Your signs and symptoms, especially if: ? You live in an area with a COVID-19 outbreak. ? You recently traveled to or from an area where the virus is common. ? You  provide care for or live with a person who was diagnosed with COVID-19. ? You were exposed to a person who was diagnosed with COVID-19.  A physical exam.  Lab tests, which may include: ? Taking a sample of fluid from the back of your nose and throat (nasopharyngeal fluid), your nose, or your throat using a swab. ? A sample of mucus from your lungs (sputum). ? Blood tests.  Imaging tests,  which may include, X-rays, CT scan, or ultrasound. How is this treated? At present, there is no medicine to treat COVID-19. Medicines that treat other diseases are being used on a trial basis to see if they are effective against COVID-19. Your health care provider will talk with you about ways to treat your symptoms. For most people, the infection is mild and can be managed at home with rest, fluids, and over-the-counter medicines. Treatment for a serious infection usually takes places in a hospital intensive care unit (ICU). It may include one or more of the following treatments. These treatments are given until your symptoms improve.  Receiving fluids and medicines through an IV.  Supplemental oxygen. Extra oxygen is given through a tube in the nose, a face mask, or a hood.  Positioning you to lie on your stomach (prone position). This makes it easier for oxygen to get into the lungs.  Continuous positive airway pressure (CPAP) or bi-level positive airway pressure (BPAP) machine. This treatment uses mild air pressure to keep the airways open. A tube that is connected to a motor delivers oxygen to the body.  Ventilator. This treatment moves air into and out of the lungs by using a tube that is placed in your windpipe.  Tracheostomy. This is a procedure to create a hole in the neck so that a breathing tube can be inserted.  Extracorporeal membrane oxygenation (ECMO). This procedure gives the lungs a chance to recover by taking over the functions of the heart and lungs. It supplies oxygen to the body and removes carbon dioxide. Follow these instructions at home: Lifestyle  If you are sick, stay home except to get medical care. Your health care provider will tell you how long to stay home. Call your health care provider before you go for medical care.  Rest at home as told by your health care provider.  Do not use any products that contain nicotine or tobacco, such as cigarettes,  e-cigarettes, and chewing tobacco. If you need help quitting, ask your health care provider.  Return to your normal activities as told by your health care provider. Ask your health care provider what activities are safe for you. General instructions  Take over-the-counter and prescription medicines only as told by your health care provider.  Drink enough fluid to keep your urine pale yellow.  Keep all follow-up visits as told by your health care provider. This is important. How is this prevented?  There is no vaccine to help prevent COVID-19 infection. However, there are steps you can take to protect yourself and others from this virus. To protect yourself:   Do not travel to areas where COVID-19 is a risk. The areas where COVID-19 is reported change often. To identify high-risk areas and travel restrictions, check the CDC travel website: FatFares.com.br  If you live in, or must travel to, an area where COVID-19 is a risk, take precautions to avoid infection. ? Stay away from people who are sick. ? Wash your hands often with soap and water for 20 seconds. If soap and water  are not available, use an alcohol-based hand sanitizer. ? Avoid touching your mouth, face, eyes, or nose. ? Avoid going out in public, follow guidance from your state and local health authorities. ? If you must go out in public, wear a cloth face covering or face mask. Make sure your mask covers your nose and mouth. ? Avoid crowded indoor spaces. Stay at least 6 feet (2 meters) away from others. ? Disinfect objects and surfaces that are frequently touched every day. This may include:  Counters and tables.  Doorknobs and light switches.  Sinks and faucets.  Electronics, such as phones, remote controls, keyboards, computers, and tablets. To protect others: If you have symptoms of COVID-19, take steps to prevent the virus from spreading to others.  If you think you have a COVID-19 infection, contact  your health care provider right away. Tell your health care team that you think you may have a COVID-19 infection.  Stay home. Leave your house only to seek medical care. Do not use public transport.  Do not travel while you are sick.  Wash your hands often with soap and water for 20 seconds. If soap and water are not available, use alcohol-based hand sanitizer.  Stay away from other members of your household. Let healthy household members care for children and pets, if possible. If you have to care for children or pets, wash your hands often and wear a mask. If possible, stay in your own room, separate from others. Use a different bathroom.  Make sure that all people in your household wash their hands well and often.  Cough or sneeze into a tissue or your sleeve or elbow. Do not cough or sneeze into your hand or into the air.  Wear a cloth face covering or face mask. Make sure your mask covers your nose and mouth. Where to find more information  Centers for Disease Control and Prevention: PurpleGadgets.be  World Health Organization: https://www.castaneda.info/ Contact a health care provider if:  You live in or have traveled to an area where COVID-19 is a risk and you have symptoms of the infection.  You have had contact with someone who has COVID-19 and you have symptoms of the infection. Get help right away if:  You have trouble breathing.  You have pain or pressure in your chest.  You have confusion.  You have bluish lips and fingernails.  You have difficulty waking from sleep.  You have symptoms that get worse. These symptoms may represent a serious problem that is an emergency. Do not wait to see if the symptoms will go away. Get medical help right away. Call your local emergency services (911 in the [place] Do not drive yourself to the hospital. Let the emergency medical personnel know if you think you have  COVID-19. Summary  COVID-19 is a respiratory infection that is caused by a virus. It is also known as coronavirus disease or novel coronavirus. It can cause serious infections, such as pneumonia, acute respiratory distress syndrome, acute respiratory failure, or sepsis.  The virus that causes COVID-19 is contagious. This means that it can spread from person to person through droplets from breathing, speaking, singing, coughing, or sneezing.  You are more likely to develop a serious illness if you are 70 years of age or older, have a weak immune system, live in a nursing home, or have chronic disease.  There is no medicine to treat COVID-19. Your health care provider will talk with you about ways to treat your symptoms.  Take steps to protect yourself and others from infection. Wash your hands often and disinfect objects and surfaces that are frequently touched every day. Stay away from people who are sick and wear a mask if you are sick. This information is not intended to replace advice given to you by your health care provider. Make sure you discuss any questions you have with your health care provider. Document Revised: 12/18/2018 Document Reviewed: 03/26/2018 Elsevier Patient Education  2020 [place].   COVID-19 COVID-19 is a respiratory infection that is caused by a virus called severe acute respiratory syndrome coronavirus 2 (SARS-CoV-2). The disease is also known as coronavirus disease or novel coronavirus. In some people, the virus may not cause any symptoms. In others, it may cause a serious infection. The infection can get worse quickly and can lead to complications, such as:  Pneumonia, or infection of the lungs.  Acute respiratory distress syndrome or ARDS. This is a condition in which fluid build-up in the lungs prevents the lungs from filling with air and passing oxygen into the blood.  Acute respiratory failure. This is a condition in which there is not enough oxygen  passing from the lungs to the body or when carbon dioxide is not passing from the lungs out of the body.  Sepsis or septic shock. This is a serious bodily reaction to an infection.  Blood clotting problems.  Secondary infections due to bacteria or fungus.  Organ failure. This is when your body's organs stop working. The virus that causes COVID-19 is contagious. This means that it can spread from person to person through droplets from coughs and sneezes (respiratory secretions). What are the causes? This illness is caused by a virus. You may catch the virus by:  Breathing in droplets from an infected person. Droplets can be spread by a person breathing, speaking, singing, coughing, or sneezing.  Touching something, like a table or a doorknob, that was exposed to the virus (contaminated) and then touching your mouth, nose, or eyes. What increases the risk? Risk for infection You are more likely to be infected with this virus if you:  Are within 6 feet (2 meters) of a person with COVID-19.  Provide care for or live with a person who is infected with COVID-19.  Spend time in crowded indoor spaces or live in shared housing. Risk for serious illness You are more likely to become seriously ill from the virus if you:  Are 13 years of age or older. The higher your age, the more you are at risk for serious illness.  Live in a nursing home or long-term care facility.  Have cancer.  Have a long-term (chronic) disease such as: ? Chronic lung disease, including chronic obstructive pulmonary disease or asthma. ? A long-term disease that lowers your body's ability to fight infection (immunocompromised). ? Heart disease, including heart failure, a condition in which the arteries that lead to the heart become narrow or blocked (coronary artery disease), a disease which makes the heart muscle thick, weak, or stiff (cardiomyopathy). ? Diabetes. ? Chronic kidney disease. ? Sickle cell disease, a  condition in which red blood cells have an abnormal "sickle" shape. ? Liver disease.  Are obese. What are the signs or symptoms? Symptoms of this condition can range from mild to severe. Symptoms may appear any time from 2 to 14 days after being exposed to the virus. They include:  A fever or chills.  A cough.  Difficulty breathing.  Headaches, body aches, or muscle  aches.  Runny or stuffy (congested) nose.  A sore throat.  New loss of taste or smell. Some people may also have stomach problems, such as nausea, vomiting, or diarrhea. Other people may not have any symptoms of COVID-19. How is this diagnosed? This condition may be diagnosed based on:  Your signs and symptoms, especially if: ? You live in an area with a COVID-19 outbreak. ? You recently traveled to or from an area where the virus is common. ? You provide care for or live with a person who was diagnosed with COVID-19. ? You were exposed to a person who was diagnosed with COVID-19.  A physical exam.  Lab tests, which may include: ? Taking a sample of fluid from the back of your nose and throat (nasopharyngeal fluid), your nose, or your throat using a swab. ? A sample of mucus from your lungs (sputum). ? Blood tests.  Imaging tests, which may include, X-rays, CT scan, or ultrasound. How is this treated? At present, there is no medicine to treat COVID-19. Medicines that treat other diseases are being used on a trial basis to see if they are effective against COVID-19. Your health care provider will talk with you about ways to treat your symptoms. For most people, the infection is mild and can be managed at home with rest, fluids, and over-the-counter medicines. Treatment for a serious infection usually takes places in a hospital intensive care unit (ICU). It may include one or more of the following treatments. These treatments are given until your symptoms improve.  Receiving fluids and medicines through an  IV.  Supplemental oxygen. Extra oxygen is given through a tube in the nose, a face mask, or a hood.  Positioning you to lie on your stomach (prone position). This makes it easier for oxygen to get into the lungs.  Continuous positive airway pressure (CPAP) or bi-level positive airway pressure (BPAP) machine. This treatment uses mild air pressure to keep the airways open. A tube that is connected to a motor delivers oxygen to the body.  Ventilator. This treatment moves air into and out of the lungs by using a tube that is placed in your windpipe.  Tracheostomy. This is a procedure to create a hole in the neck so that a breathing tube can be inserted.  Extracorporeal membrane oxygenation (ECMO). This procedure gives the lungs a chance to recover by taking over the functions of the heart and lungs. It supplies oxygen to the body and removes carbon dioxide. Follow these instructions at home: Lifestyle  If you are sick, stay home except to get medical care. Your health care provider will tell you how long to stay home. Call your health care provider before you go for medical care.  Rest at home as told by your health care provider.  Do not use any products that contain nicotine or tobacco, such as cigarettes, e-cigarettes, and chewing tobacco. If you need help quitting, ask your health care provider.  Return to your normal activities as told by your health care provider. Ask your health care provider what activities are safe for you. General instructions  Take over-the-counter and prescription medicines only as told by your health care provider.  Drink enough fluid to keep your urine pale yellow.  Keep all follow-up visits as told by your health care provider. This is important. How is this prevented?  There is no vaccine to help prevent COVID-19 infection. However, there are steps you can take to protect yourself and others from  this virus. To protect yourself:   Do not travel to areas  where COVID-19 is a risk. The areas where COVID-19 is reported change often. To identify high-risk areas and travel restrictions, check the CDC travel website: FatFares.com.br  If you live in, or must travel to, an area where COVID-19 is a risk, take precautions to avoid infection. ? Stay away from people who are sick. ? Wash your hands often with soap and water for 20 seconds. If soap and water are not available, use an alcohol-based hand sanitizer. ? Avoid touching your mouth, face, eyes, or nose. ? Avoid going out in public, follow guidance from your state and local health authorities. ? If you must go out in public, wear a cloth face covering or face mask. Make sure your mask covers your nose and mouth. ? Avoid crowded indoor spaces. Stay at least 6 feet (2 meters) away from others. ? Disinfect objects and surfaces that are frequently touched every day. This may include:  Counters and tables.  Doorknobs and light switches.  Sinks and faucets.  Electronics, such as phones, remote controls, keyboards, computers, and tablets. To protect others: If you have symptoms of COVID-19, take steps to prevent the virus from spreading to others.  If you think you have a COVID-19 infection, contact your health care provider right away. Tell your health care team that you think you may have a COVID-19 infection.  Stay home. Leave your house only to seek medical care. Do not use public transport.  Do not travel while you are sick.  Wash your hands often with soap and water for 20 seconds. If soap and water are not available, use alcohol-based hand sanitizer.  Stay away from other members of your household. Let healthy household members care for children and pets, if possible. If you have to care for children or pets, wash your hands often and wear a mask. If possible, stay in your own room, separate from others. Use a different bathroom.  Make sure that all people in your household  wash their hands well and often.  Cough or sneeze into a tissue or your sleeve or elbow. Do not cough or sneeze into your hand or into the air.  Wear a cloth face covering or face mask. Make sure your mask covers your nose and mouth. Where to find more information  Centers for Disease Control and Prevention: PurpleGadgets.be  World Health Organization: https://www.castaneda.info/ Contact a health care provider if:  You live in or have traveled to an area where COVID-19 is a risk and you have symptoms of the infection.  You have had contact with someone who has COVID-19 and you have symptoms of the infection. Get help right away if:  You have trouble breathing.  You have pain or pressure in your chest.  You have confusion.  You have bluish lips and fingernails.  You have difficulty waking from sleep.  You have symptoms that get worse. These symptoms may represent a serious problem that is an emergency. Do not wait to see if the symptoms will go away. Get medical help right away. Call your local emergency services (911 in the [place] Do not drive yourself to the hospital. Let the emergency medical personnel know if you think you have COVID-19. Summary  COVID-19 is a respiratory infection that is caused by a virus. It is also known as coronavirus disease or novel coronavirus. It can cause serious infections, such as pneumonia, acute respiratory distress syndrome, acute respiratory failure, or  sepsis.  The virus that causes COVID-19 is contagious. This means that it can spread from person to person through droplets from breathing, speaking, singing, coughing, or sneezing.  You are more likely to develop a serious illness if you are 57 years of age or older, have a weak immune system, live in a nursing home, or have chronic disease.  There is no medicine to treat COVID-19. Your health care provider will talk with you about ways to treat your  symptoms.  Take steps to protect yourself and others from infection. Wash your hands often and disinfect objects and surfaces that are frequently touched every day. Stay away from people who are sick and wear a mask if you are sick. This information is not intended to replace advice given to you by your health care provider. Make sure you discuss any questions you have with your health care provider. Document Revised: 12/18/2018 Document Reviewed: 03/26/2018 Elsevier Patient Education  [place].   Anemia  Anemia is a condition in which you do not have enough red blood cells or hemoglobin. Hemoglobin is a substance in red blood cells that carries oxygen. When you do not have enough red blood cells or hemoglobin (are anemic), your body cannot get enough oxygen and your organs may not work properly. As a result, you may feel very tired or have other problems. What are the causes? Common causes of anemia include:  Excessive bleeding. Anemia can be caused by excessive bleeding inside or outside the body, including bleeding from the intestine or from periods in women.  Poor nutrition.  Long-lasting (chronic) kidney, thyroid, and liver disease.  Bone marrow disorders.  Cancer and treatments for cancer.  HIV (human immunodeficiency virus) and AIDS (acquired immunodeficiency syndrome).  Treatments for HIV and AIDS.  Spleen problems.  Blood disorders.  Infections, medicines, and autoimmune disorders that destroy red blood cells. What are the signs or symptoms? Symptoms of this condition include:  Minor weakness.  Dizziness.  Headache.  Feeling heartbeats that are irregular or faster than normal (palpitations).  Shortness of breath, especially with exercise.  Paleness.  Cold sensitivity.  Indigestion.  Nausea.  Difficulty sleeping.  Difficulty concentrating. Symptoms may occur suddenly or develop slowly. If your anemia is mild, you may not have symptoms. How  is this diagnosed? This condition is diagnosed based on:  Blood tests.  Your medical history.  A physical exam.  Bone marrow biopsy. Your health care provider may also check your stool (feces) for blood and may do additional testing to look for the cause of your bleeding. You may also have other tests, including:  Imaging tests, such as a CT scan or MRI.  Endoscopy.  Colonoscopy. How is this treated? Treatment for this condition depends on the cause. If you continue to lose a lot of blood, you may need to be treated at a hospital. Treatment may include:  Taking supplements of iron, vitamin T11, or folic acid.  Taking a hormone medicine (erythropoietin) that can help to stimulate red blood cell growth.  Having a blood transfusion. This may be needed if you lose a lot of blood.  Making changes to your diet.  Having surgery to remove your spleen. Follow these instructions at home:  Take over-the-counter and prescription medicines only as told by your health care provider.  Take supplements only as told by your health care provider.  Follow any diet instructions that you were given.  Keep all follow-up visits as told by your health care  provider. This is important. Contact a health care provider if:  You develop new bleeding anywhere in the body. Get help right away if:  You are very weak.  You are short of breath.  You have pain in your abdomen or chest.  You are dizzy or feel faint.  You have trouble concentrating.  You have bloody or black, tarry stools.  You vomit repeatedly or you vomit up blood. Summary  Anemia is a condition in which you do not have enough red blood cells or enough of a substance in your red blood cells that carries oxygen (hemoglobin).  Symptoms may occur suddenly or develop slowly.  If your anemia is mild, you may not have symptoms.  This condition is diagnosed with blood tests as well as a medical history and physical exam. Other  tests may be needed.  Treatment for this condition depends on the cause of the anemia. This information is not intended to replace advice given to you by your health care provider. Make sure you discuss any questions you have with your health care provider. Document Revised: 01/31/2017 Document Reviewed: 03/22/2016 Elsevier Patient Education  [place].

## 2019-11-10 ENCOUNTER — Telehealth: Payer: Self-pay

## 2019-11-10 ENCOUNTER — Telehealth: Payer: Self-pay | Admitting: *Deleted

## 2019-11-10 NOTE — Telephone Encounter (Signed)
Incoming call from pt regarding recent IUD insertion following delivery. C-Section on 11/06/19. Pt notes being able to feel strings and wanted assurance that was "ok" Pt made aware as long as she can feel her strings that is fine however she should not feel  the device or as if it is about to fall out Pt denied feeling actually IUD or if it feels low enough to fall out pt advised to monitor  If severe pain or discomfort, and IUD appears to feel low or fall out to report back to [facility] for and evaluation.  Pt voiced understanding.

## 2019-11-10 NOTE — Telephone Encounter (Signed)
Contacted patient to complete Transition of Care Assessment: Transition Care Management Follow-up Telephone Call  Date of discharge and from where: 11/08/19, [facility]  How have you been since you were released from the hospital? "I'm OK"  Any questions or concerns? Yes patient states she is still having pain but it is better. Patient to call office of OB/GYN to discuss  Items Reviewed:  Did the pt receive and understand the discharge instructions provided? Yes   Medications obtained and verified? Yes   Any new allergies since your discharge? No   Dietary orders reviewed? YES  Do you have support at home? Yes family  Functional Questionnaire: (I = Independent and D = Dependent) ADLs: I  Bathing/Dressing- I  Meal Prep- I  Eating- I  Maintaining continence- I  Transferring/Ambulation- I  Managing Meds-  I  Follow up appointments reviewed:   PCP Hospital f/u appt confirmed? No  Patient would like to be assigned a PCP  Specialist Hospital f/u appt confirmed? Yes    Scheduled to see Dr Kelby Aline on 12/07/19 @ 1300 and [facility] RN on 11/22/19 at 1320  Are transportation arrangements needed? No   If their condition worsens, is the pt aware to call PCP or go to the Emergency Dept.? YES  Was the patient provided with contact information for the PCP's office or ED? YES  Was to pt encouraged to call back with questions or concerns? YES  Burnard Bunting, RN, BSN, CCRN [facility] [phone]

## 2019-11-11 ENCOUNTER — Encounter (HOSPITAL_COMMUNITY): Payer: Self-pay | Admitting: Obstetrics and Gynecology

## 2019-11-11 ENCOUNTER — Encounter (HOSPITAL_COMMUNITY): Payer: Medicaid Other

## 2019-11-12 ENCOUNTER — Encounter: Payer: Medicaid Other | Admitting: Obstetrics and Gynecology

## 2019-11-12 ENCOUNTER — Encounter (HOSPITAL_COMMUNITY): Payer: Medicaid Other

## 2019-11-16 ENCOUNTER — Inpatient Hospital Stay (HOSPITAL_COMMUNITY)
Admission: AD | Admit: 2019-11-16 | Payer: Medicaid Other | Source: Home / Self Care | Admitting: Obstetrics & Gynecology

## 2019-11-22 ENCOUNTER — Other Ambulatory Visit: Payer: Self-pay

## 2019-11-22 ENCOUNTER — Ambulatory Visit (INDEPENDENT_AMBULATORY_CARE_PROVIDER_SITE_OTHER): Payer: Medicaid Other | Admitting: *Deleted

## 2019-11-22 VITALS — BP 125/71 | HR 78

## 2019-11-22 DIAGNOSIS — Z98891 History of uterine scar from previous surgery: Secondary | ICD-10-CM

## 2019-11-22 NOTE — Progress Notes (Signed)
Subjective:     Annette Rodriguez is a 24 y.o. female who presents to the clinic 2 weeks status post c/s. Eating a regular diet without difficulty. Bowel movements are normal. Pain is controlled with current analgesics. Medications being used: ibuprofen (OTC).  The following portions of the patient's history were reviewed and updated as appropriate: allergies and current medications.  Review of Systems Pertinent items are noted in HPI.    Objective:    BP 125/71   Pulse 78  General:  alert  Abdomen: soft, bowel sounds active, non-tender  Incision:   healing well, no drainage, no erythema, no hernia, no seroma, no swelling, no dehiscence, incision well approximated     Assessment:    Doing well postoperatively.   Plan:    1. Continue any current medications. 2. Wound care discussed. 3. Follow up for PP appointment.    Marya Landry Dellion, CMA

## 2019-11-23 NOTE — Progress Notes (Signed)
Patient ID: Annette Rodriguez, female   DOB: 1996/02/20, 24 y.o.   MRN: [id number] Patient was assessed and managed by nursing staff during this encounter. I have reviewed the chart and agree with the documentation and plan. I have also made any necessary editorial changes.  Scheryl Darter, MD 11/23/2019 10:07 AM

## 2019-12-07 ENCOUNTER — Other Ambulatory Visit: Payer: Self-pay

## 2019-12-07 ENCOUNTER — Ambulatory Visit (INDEPENDENT_AMBULATORY_CARE_PROVIDER_SITE_OTHER): Payer: Medicaid Other | Admitting: Obstetrics and Gynecology

## 2019-12-07 ENCOUNTER — Encounter: Payer: Self-pay | Admitting: Obstetrics and Gynecology

## 2019-12-07 NOTE — Progress Notes (Signed)
    Patient left without being evaluated by provider.   Maretta Bees, RMA Center for [employer], [facility]

## 2019-12-31 ENCOUNTER — Encounter: Payer: Self-pay | Admitting: Obstetrics and Gynecology

## 2019-12-31 ENCOUNTER — Ambulatory Visit (INDEPENDENT_AMBULATORY_CARE_PROVIDER_SITE_OTHER): Payer: Medicaid Other | Admitting: Obstetrics and Gynecology

## 2019-12-31 ENCOUNTER — Other Ambulatory Visit: Payer: Self-pay

## 2019-12-31 NOTE — Patient Instructions (Signed)
Levonorgestrel intrauterine device (IUD) What is this medicine? LEVONORGESTREL IUD (LEE voe nor jes trel) is a contraceptive (birth control) device. The device is placed inside the uterus by a healthcare professional. It is used to prevent pregnancy. This device can also be used to treat heavy bleeding that occurs during your period. This medicine may be used for other purposes; ask your health care provider or pharmacist if you have questions. COMMON BRAND NAME(S): Kyleena, LILETTA, Mirena, Skyla What should I tell my health care provider before I take this medicine? They need to know if you have any of these conditions:  abnormal Pap smear  cancer of the breast, uterus, or cervix  diabetes  endometritis  genital or pelvic infection now or in the past  have more than one sexual partner or your partner has more than one partner  heart disease  history of an ectopic or tubal pregnancy  immune system problems  IUD in place  liver disease or tumor  problems with blood clots or take blood-thinners  seizures  use intravenous drugs  uterus of unusual shape  vaginal bleeding that has not been explained  an unusual or allergic reaction to levonorgestrel, other hormones, silicone, or polyethylene, medicines, foods, dyes, or preservatives  pregnant or trying to get pregnant  breast-feeding How should I use this medicine? This device is placed inside the uterus by a health care professional. Talk to your pediatrician regarding the use of this medicine in children. Special care may be needed. Overdosage: If you think you have taken too much of this medicine contact a poison control center or emergency room at once. NOTE: This medicine is only for you. Do not share this medicine with others. What if I miss a dose? This does not apply. Depending on the brand of device you have inserted, the device will need to be replaced every 3 to 6 years if you wish to continue using this type  of birth control. What may interact with this medicine? Do not take this medicine with any of the following medications:  amprenavir  bosentan  fosamprenavir This medicine may also interact with the following medications:  aprepitant  armodafinil  barbiturate medicines for inducing sleep or treating seizures  bexarotene  boceprevir  griseofulvin  medicines to treat seizures like carbamazepine, ethotoin, felbamate, oxcarbazepine, phenytoin, topiramate  modafinil  pioglitazone  rifabutin  rifampin  rifapentine  some medicines to treat HIV infection like atazanavir, efavirenz, indinavir, lopinavir, nelfinavir, tipranavir, ritonavir  St. John's wort  warfarin This list may not describe all possible interactions. Give your health care provider a list of all the medicines, herbs, non-prescription drugs, or dietary supplements you use. Also tell them if you smoke, drink alcohol, or use illegal drugs. Some items may interact with your medicine. What should I watch for while using this medicine? Visit your doctor or health care professional for regular check ups. See your doctor if you or your partner has sexual contact with others, becomes HIV positive, or gets a sexual transmitted disease. This product does not protect you against HIV infection (AIDS) or other sexually transmitted diseases. You can check the placement of the IUD yourself by reaching up to the top of your vagina with clean fingers to feel the threads. Do not pull on the threads. It is a good habit to check placement after each menstrual period. Call your doctor right away if you feel more of the IUD than just the threads or if you cannot feel the threads at   all. The IUD may come out by itself. You may become pregnant if the device comes out. If you notice that the IUD has come out use a backup birth control method like condoms and call your health care provider. Using tampons will not change the position of the  IUD and are okay to use during your period. This IUD can be safely scanned with magnetic resonance imaging (MRI) only under specific conditions. Before you have an MRI, tell your healthcare provider that you have an IUD in place, and which type of IUD you have in place. What side effects may I notice from receiving this medicine? Side effects that you should report to your doctor or health care professional as soon as possible:  allergic reactions like skin rash, itching or hives, swelling of the face, lips, or tongue  fever, flu-like symptoms  genital sores  high blood pressure  no menstrual period for 6 weeks during use  pain, swelling, warmth in the leg  pelvic pain or tenderness  severe or sudden headache  signs of pregnancy  stomach cramping  sudden shortness of breath  trouble with balance, talking, or walking  unusual vaginal bleeding, discharge  yellowing of the eyes or skin Side effects that usually do not require medical attention (report to your doctor or health care professional if they continue or are bothersome):  acne  breast pain  change in sex drive or performance  changes in weight  cramping, dizziness, or faintness while the device is being inserted  headache  irregular menstrual bleeding within first 3 to 6 months of use  nausea This list may not describe all possible side effects. Call your doctor for medical advice about side effects. You may report side effects to FDA at 1-800-FDA-1088. Where should I keep my medicine? This does not apply. NOTE: This sheet is a summary. It may not cover all possible information. If you have questions about this medicine, talk to your doctor, pharmacist, or health care provider.  2020 Elsevier/Gold Standard (2017-12-30 13:22:01)  

## 2019-12-31 NOTE — Progress Notes (Addendum)
Post Partum Visit Note  Annette Rodriguez is a 24 y.o. [phone] female who presents for a postpartum visit. She is 7 weeks postpartum following a repeat cesarean section.  I have fully reviewed the prenatal and intrapartum course. The delivery was at [redacted]w[redacted]d gestational weeks.  Anesthesia: spinal. Postpartum course has been unremarkable. Baby is doing well. Baby is feeding by breast. Bleeding spotting.. Bowel function is normal. Bladder function is normal. Patient is not sexually active. Contraception method is IUD. Postpartum depression screening: negative. EPDS= 1  The pregnancy intention screening data noted above was reviewed. Potential methods of contraception were discussed. The patient elected to proceed with IUD or IUS.      The following portions of the patient's history were reviewed and updated as appropriate: allergies, current medications, past family history, past medical history, past social history, past surgical history and problem list.  Review of Systems  Review of Systems  Constitutional: Negative.   HENT: Negative.   Eyes: Negative.   Respiratory: Negative.   Cardiovascular: Negative.   Gastrointestinal: Negative.   Genitourinary: Negative.   Musculoskeletal: Negative.   Skin:       Numbness at incision site  Neurological: Negative.   Psychiatric/Behavioral: Negative.    Objective:  Blood pressure 114/76, pulse (!) 101, height 4\' 11"  (1.499 m), weight 106 lb (48.1 kg), currently breastfeeding.  General:  alert, cooperative and no distress   Breasts:  deferred  Lungs: clear to auscultation bilaterally  Heart:  regular rate and rhythm  Abdomen: soft, non-tender; bowel sounds normal; no masses,  no organomegaly, c/s scar well healed, small keloid noted   Vulva:  normal  Vagina: normal vagina  Cervix:  no cervical motion tenderness, no lesions and iud strings trimmed to 2 cm  Corpus: normal size, contour, position, consistency, mobility, non-tender  Adnexa:   normal adnexa  Rectal Exam: Not performed.        Assessment:    normal postpartum exam. Pap smear not done at today's visit.   Plan:   Essential components of care per ACOG recommendations:  1.  Mood and well being: Patient with negative depression screening today. Reviewed local resources for support.  - Patient does not use tobacco.  - hx of drug use? No    2. Infant care and feeding:  -Patient currently breastmilk feeding? Yes  If breastmilk feeding discussed return to work and pumping. If needed, patient was provided letter for work to allow for every 2-3 hr pumping breaks, and to be granted a private location to express breastmilk and refrigerated area to store breastmilk. Reviewed importance of draining breast regularly to support lactation. -Social determinants of health (SDOH) reviewed in EPIC. No concerns  3. Sexuality, contraception and birth spacing - Patient does not want a pregnancy in the next year.   - Reviewed forms of contraception in tiered fashion. Patient desired IUD today.   - Discussed birth spacing of 18 months  4. Sleep and fatigue -Encouraged family/partner/community support of 4 hrs of uninterrupted sleep to help with mood and fatigue  5. Physical Recovery  - Discussed patients delivery - Patient had a no lacerations, perineal healing reviewed. Patient expressed understanding - Patient has urinary incontinence? No - Patient is safe to resume physical and sexual activity  6.  Health Maintenance - Last pap smear done 04/23/17 and was normal with negative HPV.   Pt will follow up 08/2020 for AE/pap 09/2020, MD Center for [facility], [facility]

## 2020-05-02 ENCOUNTER — Ambulatory Visit (INDEPENDENT_AMBULATORY_CARE_PROVIDER_SITE_OTHER): Payer: Medicaid Other | Admitting: Obstetrics & Gynecology

## 2020-05-02 ENCOUNTER — Encounter: Payer: Self-pay | Admitting: Obstetrics & Gynecology

## 2020-05-02 ENCOUNTER — Other Ambulatory Visit: Payer: Self-pay

## 2020-05-02 VITALS — BP 121/76 | HR 94 | Ht 59.0 in | Wt 114.0 lb

## 2020-05-02 DIAGNOSIS — T8339XA Other mechanical complication of intrauterine contraceptive device, initial encounter: Secondary | ICD-10-CM | POA: Diagnosis not present

## 2020-05-02 DIAGNOSIS — Z975 Presence of (intrauterine) contraceptive device: Secondary | ICD-10-CM | POA: Diagnosis not present

## 2020-05-02 NOTE — Patient Instructions (Signed)
Levonorgestrel intrauterine device (IUD) What is this medicine? LEVONORGESTREL IUD (LEE voe nor jes trel) is a contraceptive (birth control) device. The device is placed inside the uterus by a health care provider. It is used to prevent pregnancy. Some devices can also be used to treat heavy bleeding that occurs during your period. This medicine may be used for other purposes; ask your health care provider or pharmacist if you have questions. COMMON BRAND NAME(S): Kyleena, LILETTA, Mirena, Skyla What should I tell my health care provider before I take this medicine? They need to know if you have any of these conditions:  abnormal Pap smear  cancer of the breast, uterus, or cervix  diabetes  endometritis  genital or pelvic infection now or in the past  have more than one sexual partner or your partner has more than one partner  heart disease  history of an ectopic or tubal pregnancy  immune system problems  IUD in place  liver disease or tumor  problems with blood clots or take blood-thinners  seizures  use intravenous drugs  uterus of unusual shape  vaginal bleeding that has not been explained  an unusual or allergic reaction to levonorgestrel, other hormones, silicone, or polyethylene, medicines, foods, dyes, or preservatives  pregnant or trying to get pregnant  breast-feeding How should I use this medicine? This device is placed inside the uterus by a health care professional. Talk to your pediatrician regarding the use of this medicine in children. Special care may be needed. Overdosage: If you think you have taken too much of this medicine contact a poison control center or emergency room at once. NOTE: This medicine is only for you. Do not share this medicine with others. What if I miss a dose? This does not apply. Depending on the brand of device you have inserted, the device will need to be replaced every 3 to 7 years if you wish to continue using this type  of birth control. What may interact with this medicine? Do not take this medicine with any of the following medications:  amprenavir  bosentan  fosamprenavir This medicine may also interact with the following medications:  aprepitant  armodafinil  barbiturate medicines for inducing sleep or treating seizures  bexarotene  boceprevir  griseofulvin  medicines to treat seizures like carbamazepine, ethotoin, felbamate, oxcarbazepine, phenytoin, topiramate  modafinil  pioglitazone  rifabutin  rifampin  rifapentine  some medicines to treat HIV infection like atazanavir, efavirenz, indinavir, lopinavir, nelfinavir, tipranavir, ritonavir  St. John's wort  warfarin This list may not describe all possible interactions. Give your health care provider a list of all the medicines, herbs, non-prescription drugs, or dietary supplements you use. Also tell them if you smoke, drink alcohol, or use illegal drugs. Some items may interact with your medicine. What should I watch for while using this medicine? Visit your doctor or health care professional for regular check ups. See your doctor if you or your partner has sexual contact with others, becomes HIV positive, or gets a sexual transmitted disease. This product does not protect you against HIV infection (AIDS) or other sexually transmitted diseases. You can check the placement of the IUD yourself by reaching up to the top of your vagina with clean fingers to feel the threads. Do not pull on the threads. It is a good habit to check placement after each menstrual period. Call your doctor right away if you feel more of the IUD than just the threads or if you cannot feel the threads   at all. The IUD may come out by itself. You may become pregnant if the device comes out. If you notice that the IUD has come out use a backup birth control method like condoms and call your health care provider. Using tampons will not change the position of the  IUD and are okay to use during your period. This IUD can be safely scanned with magnetic resonance imaging (MRI) only under specific conditions. Before you have an MRI, tell your healthcare provider that you have an IUD in place, and which type of IUD you have in place. What side effects may I notice from receiving this medicine? Side effects that you should report to your doctor or health care professional as soon as possible:  allergic reactions like skin rash, itching or hives, swelling of the face, lips, or tongue  fever, flu-like symptoms  genital sores  high blood pressure  no menstrual period for 6 weeks during use  pain, swelling, warmth in the leg  pelvic pain or tenderness  severe or sudden headache  signs of pregnancy  stomach cramping  sudden shortness of breath  trouble with balance, talking, or walking  unusual vaginal bleeding, discharge  yellowing of the eyes or skin Side effects that usually do not require medical attention (report to your doctor or health care professional if they continue or are bothersome):  acne  breast pain  change in sex drive or performance  changes in weight  cramping, dizziness, or faintness while the device is being inserted  headache  irregular menstrual bleeding within first 3 to 6 months of use  nausea This list may not describe all possible side effects. Call your doctor for medical advice about side effects. You may report side effects to FDA at 1-800-FDA-1088. Where should I keep my medicine? This does not apply. NOTE: This sheet is a summary. It may not cover all possible information. If you have questions about this medicine, talk to your doctor, pharmacist, or health care provider.  2021 Elsevier/Gold Standard (2019-10-19 16:27:45)  

## 2020-05-02 NOTE — Progress Notes (Signed)
GYN presents for IUD string check.

## 2020-05-02 NOTE — Progress Notes (Signed)
Patient ID: Annette Rodriguez, female   DOB: 1996-01-06, 25 y.o.   MRN: [id number]  Chief Complaint  Patient presents with  . Contraception    IUD check    HPI Annette Rodriguez is a 25 y.o. female.  IUD is in place and strings were trimmed to 2 cm last visit in 12/2019. She notes the strings are protruding from the vagina.No pain, some spotting. She is nursing her 34 mo old   Past Medical History:  Diagnosis Date  . Anemia   . Anemia affecting pregnancy in third trimester 04/29/2017   H 9.6 04/29/17  . Cystic fibrosis carrier   . Headache   . Insufficient prenatal care 04/23/2017   NOB visit at 31 weeks per patient.  S<D.    . Post-operative state 06/07/2017  . Supervision of high risk pregnancy, antepartum 04/23/2017    Clinic  [facility]  Prenatal Labs Dating LMP  Blood type: O/Positive/-- (02/20 1347)  Genetic Screen  AFP:          NIPS:Panorama: Neg Antibody:Negative (02/20 1347) Anatomic [place]  Rubella: 3.02 (02/20 1347) GTT   Third trimester: WNL RPR: Non Reactive (02/26 1108)  Flu vaccine Declined 04/23/17 HBsAg: Negative (02/20 1347)  TDaP vaccine 04/23/17                                  Rhogam:n/a O+ HIV: Non Re  . Vaginal Pap smear, abnormal   . Vitamin D deficiency 04/25/2017    Past Surgical History:  Procedure Laterality Date  . CESAREAN SECTION  05/11/2015  . CESAREAN SECTION N/A 06/07/2017   Procedure: CESAREAN SECTION;  Surgeon: Hermina Staggers, MD;  Location[facility] BIRTHING SUITES;  Service: Obstetrics;  Laterality: N/A;  . CESAREAN SECTION N/A 11/06/2019   Procedure: CESAREAN SECTION;  Surgeon: Hermina Staggers, MD;  Location: [facility] LD ORS;  Service: Obstetrics;  Laterality: N/A;    Family History  Problem Relation Age of Onset  . Asthma Maternal Grandmother     Social History Social History   Tobacco Use  . Smoking status: Never Smoker  . Smokeless tobacco: Never Used  Vaping Use  . Vaping Use: Never used  Substance Use Topics  . Alcohol use: No  . Drug use: No    Comment:  Stopped +UPT     No Known Allergies  Current Outpatient Medications  Medication Sig Dispense Refill  . Prenatal Vit-Fe Phos-FA-Omega (VITAFOL GUMMIES) 3.33-0.333-34.8 MG CHEW Chew 3 tablets by mouth daily before breakfast. 90 tablet 11  . Ferrous Sulfate (IRON) 325 (65 Fe) MG TABS Take 1 tablet (325 mg total) by mouth 2 (two) times daily with a meal. Take with breakfast and dinner, every other day. (Patient not taking: Reported on 12/31/2019) 30 tablet 0  . ibuprofen (ADVIL) 800 MG tablet Take 1 tablet (800 mg total) by mouth 3 (three) times daily. (Patient not taking: Reported on 12/07/2019) 30 tablet 0  . Levonorgestrel (LILETTA, 52 MG, IU) by Intrauterine route.    Marland Kitchen oxyCODONE-acetaminophen (PERCOCET) 5-325 MG tablet Take 1-2 tablets by mouth every 6 (six) hours as needed for severe pain. (Patient not taking: Reported on 12/07/2019) 20 tablet 0  . Pyridoxine HCl (B-6 PO) Take by mouth. (Patient not taking: Reported on 12/07/2019)     No current facility-administered medications for this visit.    Review of Systems Review of Systems  Gastrointestinal: Negative.   Genitourinary: Positive for vaginal bleeding. Negative  for menstrual problem, pelvic pain and vaginal discharge.    Blood pressure 121/76, pulse 94, height 4\' 11"  (1.499 m), weight 114 lb (51.7 kg), currently breastfeeding.  Physical Exam Physical Exam Exam conducted with a chaperone present.  Constitutional:      Appearance: Normal appearance.  Pulmonary:     Effort: Pulmonary effort is normal.  Genitourinary:    General: Normal vulva.     Exam position: Lithotomy position.     Cervix: Normal.     Uterus: Normal.      Comments: String protruding from vaginal introitus and is at least 10 cm long at the os.String trimmed to 2-3 cm, no sign of palpable IUD at the os Neurological:     Mental Status: She is alert.      Data Reviewed Last office note  Assessment Other mechanical complication of intrauterine  contraceptive device, initial encounter   Plan Routine care for well woman exams    05/02/2020, 3:24 PM

## 2020-11-05 ENCOUNTER — Emergency Department (HOSPITAL_COMMUNITY)
Admission: EM | Admit: 2020-11-05 | Discharge: 2020-11-05 | Disposition: A | Discharge status: Home / Self Care | Payer: Medicaid Other | Attending: Emergency Medicine | Admitting: Emergency Medicine

## 2020-11-05 ENCOUNTER — Other Ambulatory Visit: Payer: Self-pay

## 2020-11-05 ENCOUNTER — Encounter (HOSPITAL_COMMUNITY): Payer: Self-pay | Admitting: *Deleted

## 2020-11-05 ENCOUNTER — Emergency Department (HOSPITAL_COMMUNITY): Payer: Medicaid Other

## 2020-11-05 DIAGNOSIS — R0789 Other chest pain: Secondary | ICD-10-CM | POA: Diagnosis not present

## 2020-11-05 DIAGNOSIS — Z8616 Personal history of COVID-19: Secondary | ICD-10-CM | POA: Diagnosis not present

## 2020-11-05 DIAGNOSIS — R079 Chest pain, unspecified: Secondary | ICD-10-CM | POA: Diagnosis not present

## 2020-11-05 DIAGNOSIS — N898 Other specified noninflammatory disorders of vagina: Secondary | ICD-10-CM | POA: Diagnosis not present

## 2020-11-05 LAB — CBC
HCT: 40.2 % (ref 36.0–46.0)
Hemoglobin: 13.1 g/dL (ref 12.0–15.0)
MCH: 28.6 pg (ref 26.0–34.0)
MCHC: 32.6 g/dL (ref 30.0–36.0)
MCV: 87.8 fL (ref 80.0–100.0)
Platelets: 264 10*3/uL (ref 150–400)
RBC: 4.58 MIL/uL (ref 3.87–5.11)
RDW: 13.7 % (ref 11.5–15.5)
WBC: 12.7 10*3/uL — ABNORMAL HIGH (ref 4.0–10.5)
nRBC: 0 % (ref 0.0–0.2)

## 2020-11-05 LAB — I-STAT BETA HCG BLOOD, ED (MC, WL, AP ONLY): I-stat hCG, quantitative: <5 m[IU]/mL (ref <5)

## 2020-11-05 LAB — BASIC METABOLIC PANEL
Anion gap: 9 (ref 5–15)
BUN: 21 mg/dL — ABNORMAL HIGH (ref 6–20)
CO2: 24 mmol/L (ref 22–32)
Calcium: 9.6 mg/dL (ref 8.9–10.3)
Chloride: 105 mmol/L (ref 98–111)
Creatinine, Ser: 0.78 mg/dL (ref 0.44–1.00)
GFR, Estimated: >60 mL/min (ref >60)
Glucose, Bld: 91 mg/dL (ref 70–99)
Potassium: 3.9 mmol/L (ref 3.5–5.1)
Sodium: 138 mmol/L (ref 135–145)

## 2020-11-05 LAB — TROPONIN I (HIGH SENSITIVITY)
Troponin I (High Sensitivity): <2 ng/L (ref <18)
Troponin I (High Sensitivity): <2 ng/L (ref <18)

## 2020-11-05 MED ORDER — IBUPROFEN 400 MG PO TABS: 400.0000 mg | ORAL_TABLET | Freq: Four times a day (QID) | ORAL | 0 refills | Status: AC | PRN

## 2020-11-05 MED ORDER — IBUPROFEN 400 MG PO TABS
400.0000 mg | ORAL_TABLET | Freq: Once | ORAL | Status: AC
  Administered 2020-11-05: 400 mg via ORAL
  Filled 2020-11-05: qty 1

## 2020-11-05 NOTE — ED Triage Notes (Signed)
The pt took tylenol  3 hours ago

## 2020-11-05 NOTE — ED Provider Notes (Signed)
[facility] EMERGENCY DEPARTMENT Provider Note   CSN: [id number] Arrival date & time: 11/05/20  1910     History Chief Complaint  Patient presents with   Chest Pain    Annette Rodriguez is a 25 y.o. female.  The history is provided by the patient. No language interpreter was used.  Chest Pain  25 year old female with history of anemia who presents for evaluation of chest pain.  Patient report earlier in the day she was reaching down to help pick up her daughter when she felt pain about her chest.  She described pain as a sharp sensation to R chest as if she has a bubble in her chest.  Later on she noticed more pain when she moves her arm.  Pain has been ongoing throughout the day not improved despite taking Tylenol.  Pain is not associate with fever chills productive cough hemoptysis nausea vomiting diaphoresis back pain or abdominal pain.  She has history of GERD but states that this pain felt different.  She denies any significant family history of cardiac disease and no one died from premature cardiac illness.  No prior history of PE or DVT no recent surgery prolonged bedrest active cancer leg swelling calf pain or taking oral hormone.  She denies alcohol or tobacco abuse.  She rates the pain as 9 out of 10.  Past Medical History:  Diagnosis Date   Anemia    Anemia affecting pregnancy in third trimester 04/29/2017   H 9.6 04/29/17   Cystic fibrosis carrier    Headache    Insufficient prenatal care 04/23/2017   NOB visit at 31 weeks per patient.  S<D.     Post-operative state 06/07/2017   Supervision of high risk pregnancy, antepartum 04/23/2017    Clinic  [facility]  Prenatal Labs Dating LMP  Blood type: O/Positive/-- (02/20 1347)  Genetic Screen  AFP:          NIPS:Panorama: Neg Antibody:Negative (02/20 1347) Anatomic [place]  Rubella: 3.02 (02/20 1347) GTT   Third trimester: WNL RPR: Non Reactive (02/26 1108)  Flu vaccine Declined 04/23/17 HBsAg: Negative (02/20 1347)  TDaP  vaccine 04/23/17                                  Rhogam:n/a O+ HIV: Non Re   Vaginal Pap smear, abnormal    Vitamin D deficiency 04/25/2017    Patient Active Problem List   Diagnosis Date Noted   Presence of 52 mg levonorgestrel-releasing intrauterine device (IUD) 05/02/2020   COVID-19 11/06/2019   History of cesarean section    Alpha thalassemia silent carrier 06/09/2019   History of placenta abruption 04/23/2017   History of C-section 04/23/2017    Past Surgical History:  Procedure Laterality Date   CESAREAN SECTION  05/11/2015   CESAREAN SECTION N/A 06/07/2017   Procedure: CESAREAN SECTION;  Surgeon: Hermina Staggers, MD;  Location: [facility] BIRTHING SUITES;  Service: Obstetrics;  Laterality: N/A;   CESAREAN SECTION N/A 11/06/2019   Procedure: CESAREAN SECTION;  Surgeon: Hermina Staggers, MD;  Location: [facility] LD ORS;  Service: Obstetrics;  Laterality: N/A;     OB History     Gravida  3   Para  3   Term  2   Preterm  1   AB      Living  3      SAB      IAB  Ectopic      Multiple  0   Live Births  3           Family History  Problem Relation Age of Onset   Asthma Maternal Grandmother     Social History   Tobacco Use   Smoking status: Never   Smokeless tobacco: Never  Vaping Use   Vaping Use: Never used  Substance Use Topics   Alcohol use: No   Drug use: No    Comment: Stopped +UPT     Home Medications Prior to Admission medications   Medication Sig Start Date End Date Taking? Authorizing Provider  Ferrous Sulfate (IRON) 325 (65 Fe) MG TABS Take 1 tablet (325 mg total) by mouth 2 (two) times daily with a meal. Take with breakfast and dinner, every other day. Patient not taking: Reported on 12/31/2019 11/08/19   Rasch, Victorino Dike I, NP  ibuprofen (ADVIL) 800 MG tablet Take 1 tablet (800 mg total) by mouth 3 (three) times daily. Patient not taking: Reported on 12/07/2019 11/08/19   Rasch, Victorino Dike I, NP  Levonorgestrel (LILETTA, 52 MG, IU) by Intrauterine  route. 11/06/19   [provider]  oxyCODONE-acetaminophen (PERCOCET) 5-325 MG tablet Take 1-2 tablets by mouth every 6 (six) hours as needed for severe pain. Patient not taking: Reported on 12/07/2019 11/08/19 11/07/20  Rasch, Victorino Dike I, NP  Prenatal Vit-Fe Phos-FA-Omega (VITAFOL GUMMIES) 3.33-0.333-34.8 MG CHEW Chew 3 tablets by mouth daily before breakfast. 06/07/19   Brock Bad, MD  Pyridoxine HCl (B-6 PO) Take by mouth. Patient not taking: Reported on 12/07/2019    [provider]    Allergies    Patient has no known allergies.  Review of Systems   Review of Systems  Cardiovascular:  Positive for chest pain.  All other systems reviewed and are negative.  Physical Exam Updated Vital Signs BP 134/90   Pulse 94   Temp 98.9 F (37.2 C) (Oral)   Resp 18   Ht 4\' 11"  (1.499 m)   Wt 51.7 kg   LMP  (LMP Unknown) Comment: irregular due to bc per pt  SpO2 100%   BMI 23.02 kg/m   Physical Exam Vitals and nursing note reviewed.  Constitutional:      General: She is not in acute distress.    Appearance: She is well-developed.  HENT:     Head: Atraumatic.  Eyes:     Conjunctiva/sclera: Conjunctivae normal.  Cardiovascular:     Rate and Rhythm: Normal rate and regular rhythm.  Pulmonary:     Effort: Pulmonary effort is normal.  Chest:     Chest wall: Tenderness (Tenderness to palpation of right anterior chest wall without any crepitus or emphysema noted.) present.  Abdominal:     Palpations: Abdomen is soft.     Tenderness: There is no abdominal tenderness.  Musculoskeletal:     Cervical back: Neck supple.     Right lower leg: No edema.     Left lower leg: No edema.  Skin:    Findings: No rash.  Neurological:     Mental Status: She is alert. Mental status is at baseline.  Psychiatric:        Mood and Affect: Mood normal.    ED Results / Procedures / Treatments   Labs (all labs ordered are listed, but only abnormal results are displayed) Labs  Reviewed  BASIC METABOLIC PANEL - Abnormal; Notable for the following components:      Result Value   BUN 21 (*)  All other components within normal limits  CBC - Abnormal; Notable for the following components:   WBC 12.7 (*)    All other components within normal limits  I-STAT BETA HCG BLOOD, ED ([facility], [facility], [facility] ONLY)  TROPONIN I (HIGH SENSITIVITY)  TROPONIN I (HIGH SENSITIVITY)    EKG EKG Interpretation  Date/Time:  Sunday November 05 2020 19:33:42 EDT Ventricular Rate:  88 PR Interval:  146 QRS Duration: 78 QT Interval:  376 QTC Calculation: 454 R Axis:   61 Text Interpretation: Normal sinus rhythm Normal ECG Confirmed by Dykstra, Richard ([id number]) on 11/05/2020 10:23:33 PM  Radiology DG Chest 2 View  Result Date: 11/05/2020 CLINICAL DATA:  Chest pain EXAM: CHEST - 2 VIEW COMPARISON:  None. FINDINGS: The heart size and mediastinal contours are within normal limits. No focal consolidation. No pleural effusion. No pneumothorax. The visualized skeletal structures are unremarkable. IMPRESSION: No active cardiopulmonary disease. Electronically Signed   By: Jeffrey  Waltz M.D.   On: 11/05/2020 20:13    Procedures Procedures   Medications Ordered in ED Medications  ibuprofen (ADVIL) tablet 400 mg (400 mg Oral Given 11/05/20 2238)    ED Course  I have reviewed the triage vital signs and the nursing notes.  Pertinent labs & imaging results that were available during my care of the patient were reviewed by me and considered in my medical decision making (see chart for details).    MDM Rules/Calculators/A&P                           BP 134/90   Pulse 94   Temp 98.9 F (37.2 C) (Oral)   Resp 18   Ht 4\' 11" (1.499 m)   Wt 51.7 kg   LMP  (LMP Unknown) Comment: irregular due to bc per pt  SpO2 100%   BMI 23.02 kg/m   Final Clinical Impression(s) / ED Diagnoses Final diagnoses:  Atypical chest pain    Rx / DC Orders ED Discharge Orders          Ordered    ibuprofen  (ADVIL) 400 MG tablet  Every 6 hours PRN        11/05/20 2313           10 :30 PM Patient presents with reproducible right chest wall pain atypical of ACS.  She is PERC negative, doubt PE.  EKG without concerning feature, negative delta troponin, chest x-ray unremarkable, mild elevated white count of 12.7, nonspecific but likely not infectious in etiology.  Will provide symptom control but patient otherwise stable for discharge.   , PA-C 11/05/20 2314    01/05/21, MD 11/07/20 2224

## 2020-11-05 NOTE — ED Notes (Signed)
Pt discharged and ambulated out of the ED without difficulty. 

## 2020-11-05 NOTE — ED Triage Notes (Signed)
The pt is c/o chest pain since 1200 noon today with chest pain no previous history  lmp  irregular peroids

## 2020-11-07 ENCOUNTER — Telehealth: Payer: Self-pay

## 2020-11-07 NOTE — Telephone Encounter (Signed)
Transition Care Management Follow-up Telephone Call Date of discharge and from where: 11/05/2020 from [facility] How have you been since you were released from the hospital? Pt stated that she is feeling some better. Pt did not have any questions or concerns.  Any questions or concerns? No  Items Reviewed: Did the pt receive and understand the discharge instructions provided? Yes  Medications obtained and verified? Yes  Other? No  Any new allergies since your discharge? No  Dietary orders reviewed? No Do you have support at home? Yes   Functional Questionnaire: (I = Independent and D = Dependent) ADLs: I  Bathing/Dressing- I  Meal Prep- I  Eating- I  Maintaining continence- I  Transferring/Ambulation- I  Managing Meds- I   Follow up appointments reviewed:  PCP Hospital f/u appt confirmed? No  Pt is not interested in establishing with a primary care provider.  Specialist Hospital f/u appt confirmed? No  Are transportation arrangements needed? No  If their condition worsens, is the pt aware to call PCP or go to the Emergency Dept.? Yes Was the patient provided with contact information for the PCP's office or ED? Yes Was to pt encouraged to call back with questions or concerns? Yes

## 2021-05-07 IMAGING — US US MFM OB DETAIL+14 WK
1 series · 13 of 28 positions shown · non-contrast
Comparison: none

[Series 1: us mfm ob detail+14 wk · 106 acquisitions, 13 frames shown]
[im 4/106]
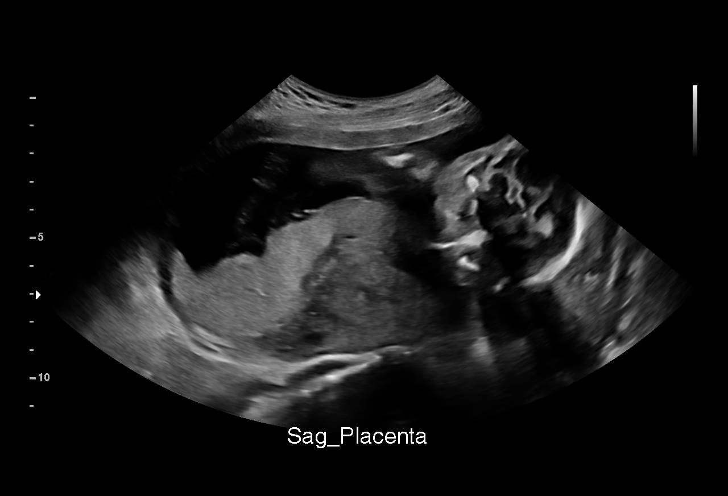
[im 12/106]
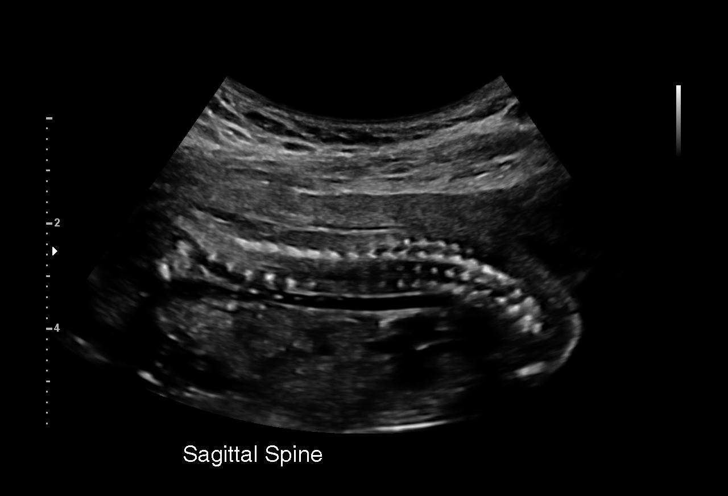
[im 20/106]
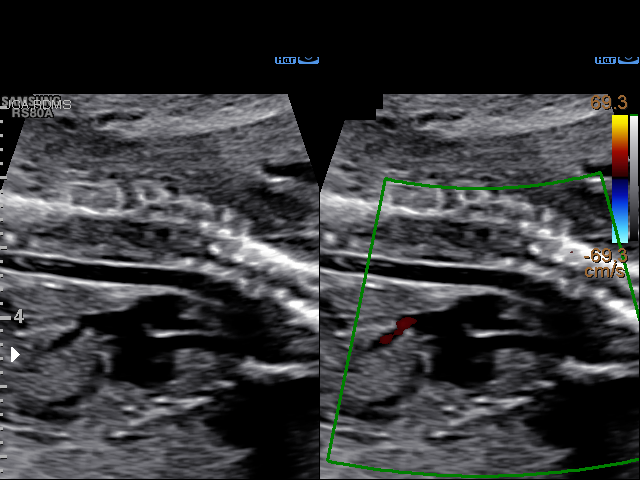
[im 28/106]
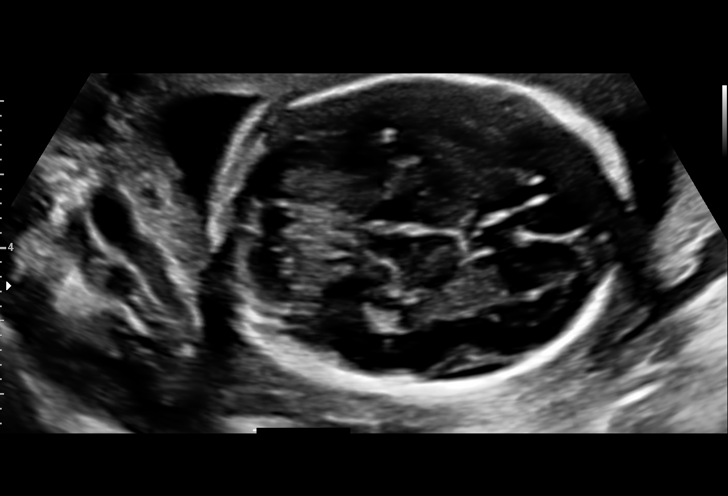
[im 36/106]
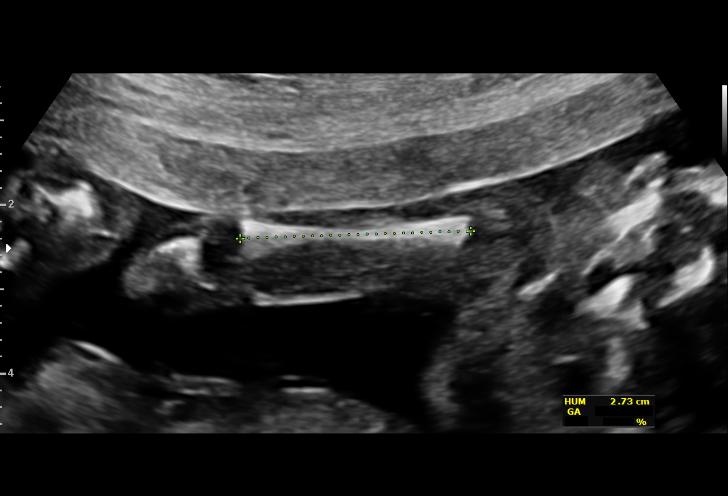
[im 43/106]
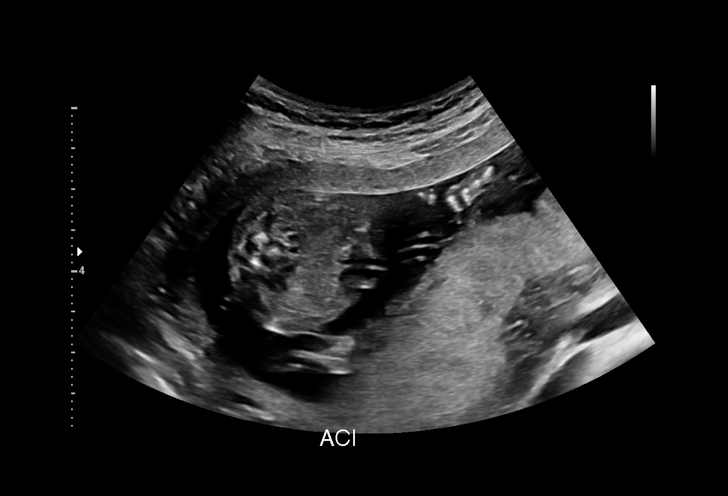
[im 55/106]
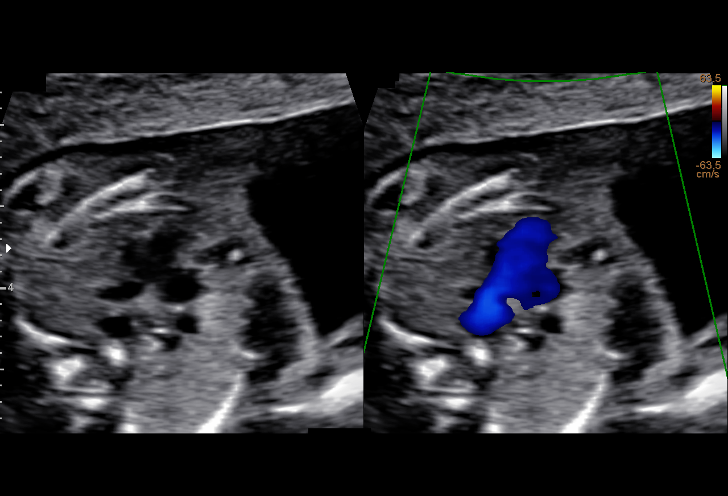
[im 63/106]
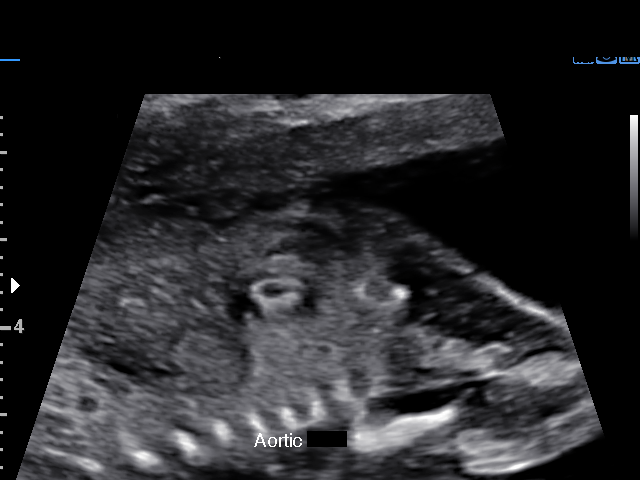
[im 71/106]
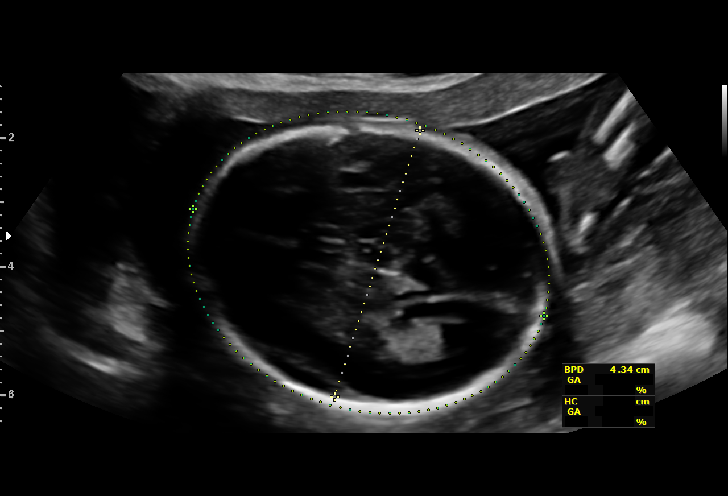
[im 78/106]
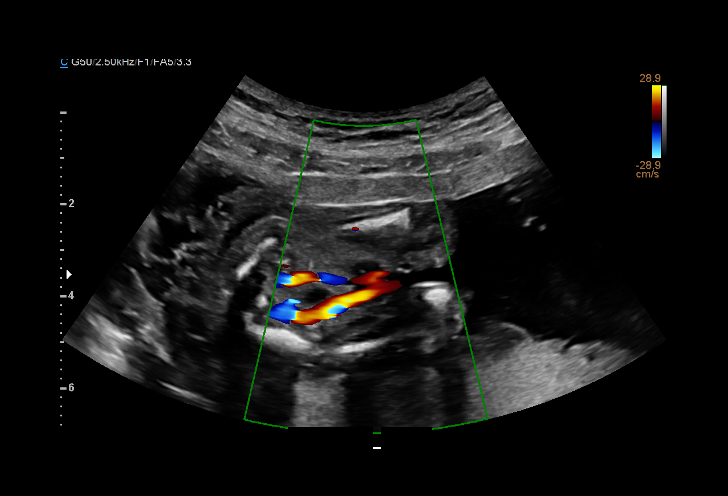
[im 86/106]
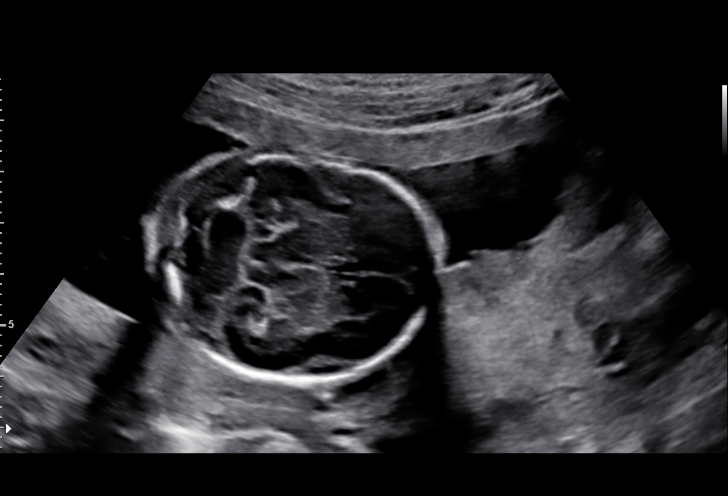
[im 94/106]
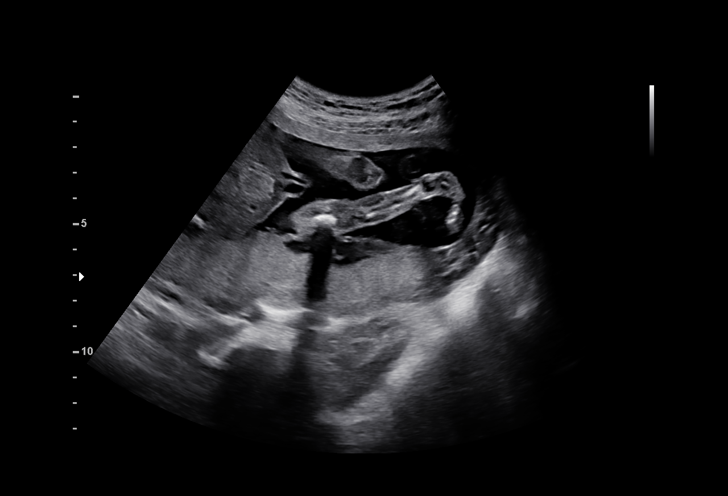
[im 102/106]
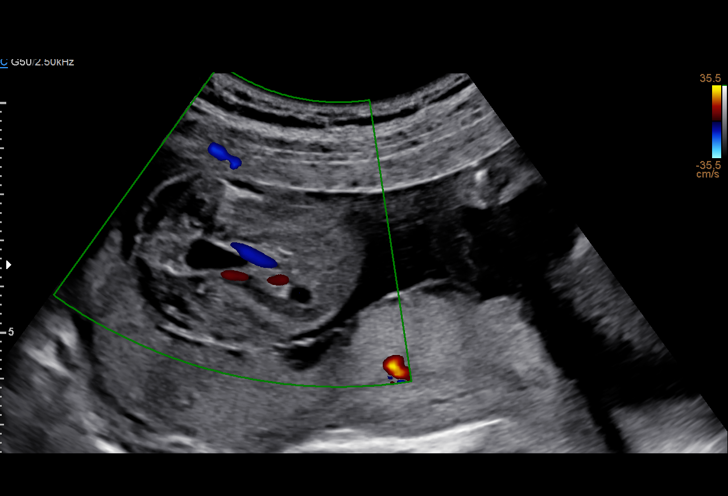

[13 of 28 positions shown; findings below may reference images not displayed]

FERHAD EMIRA CNM

 ----------------------------------------------------------------------

 ----------------------------------------------------------------------
Indications

  Cystic Fibrosis (CF) Carrier, second trimester
  Encounter for antenatal screening for
  malformations (Low Risk NIPS)
  Genetic carrier (Silent carrier for Alpha-
  Thalassemia)
  19 weeks gestation of pregnancy
 ----------------------------------------------------------------------
Fetal Evaluation

 Num Of Fetuses:         1
 Fetal Heart Rate(bpm):  149
 Cardiac Activity:       Observed
 Presentation:           Cephalic
 Placenta:               Posterior
 P. Cord Insertion:      Visualized

 Amniotic Fluid
 AFI FV:      Within normal limits

                             Largest Pocket(cm)

Biometry

 BPD:      43.3  mm     G. Age:  19w 1d         55  %    CI:        69.85   %    70 - 86
                                                         FL/HC:      17.4   %    16.1 -
 HC:      165.3  mm     G. Age:  19w 2d         55  %    HC/AC:      1.16        1.09 -
 AC:      142.4  mm     G. Age:  19w 4d         66  %    FL/BPD:     66.5   %
 FL:       28.8  mm     G. Age:  18w 6d         37  %    FL/AC:      20.2   %    20 - 24
 HUM:      27.3  mm     G. Age:  18w 5d         42  %
 CER:      19.7  mm     G. Age:  18w 6d         46  %
 NFT:       5.4  mm
 LV:        5.8  mm
 CM:        3.5  mm
 Est. FW:     282  gm    0 lb 10 oz      61  %
OB History

 Gravidity:    3         Term:   1        Prem:   1        SAB:   0
 TOP:          0       Ectopic:  0        Living: 2
Gestational Age

 LMP:           19w 0d        Date:  02/16/19                 EDD:   11/23/19
 Clinical EDD:  19w 0d                                        EDD:   11/23/19
 U/S Today:     19w 2d                                        EDD:   11/21/19
 Best:          19w 0d     Det. By:  LMP  (02/16/19)          EDD:   11/23/19
Anatomy

 Cranium:               Appears normal         Aortic Arch:            Appears normal
 Cavum:                 Appears normal         Ductal Arch:            Appears normal
 Ventricles:            Appears normal         Diaphragm:              Appears normal
 Choroid Plexus:        Appears normal         Stomach:                Appears normal, left
                                                                       sided
 Cerebellum:            Appears normal         Abdomen:                Appears normal
 Posterior Fossa:       Appears normal         Abdominal Wall:         Appears nml (cord
                                                                       insert, abd wall)
 Nuchal Fold:           Appears normal         Cord Vessels:           Appears normal (3
                                                                       vessel cord)
 Face:                  Appears normal         Kidneys:                Appear normal
                        (orbits and profile)
 Lips:                  Appears normal         Bladder:                Appears normal
 Thoracic:              Appears normal         Spine:                  Ltd views no
                                                                       intracranial signs of
                                                                       NTD
 Heart:                 Appears normal         Upper Extremities:      Appears normal
                        (4CH, axis, and
                        situs)
 RVOT:                  Appears normal         Lower Extremities:      Appears normal
 LVOT:                  Appears normal

 Other:  Nasal bone visualized. Female gender Technically difficult due to fetal
         position.
Cervix Uterus Adnexa

 Cervix
 Length:            3.3  cm.
 Normal appearance by transabdominal scan.
Comments

 This patient was seen for a detailed fetal anatomy scan as
 she is a carrier for cystic fibrosis.  She reports that the father
 the baby recently got tested to determine if he is also a
 carrier for cystic fibrosis.  The results of his tests are currently
 pending.
 She denies any other significant past medical history and
 denies any problems in her current pregnancy.
 She had a cell free DNA test earlier in her pregnancy which
 indicated a low risk for trisomy 21, 18, and 13. A female fetus
 is predicted.
 She was informed that the fetal growth and amniotic fluid
 level were appropriate for her gestational age.
 There were no obvious fetal anomalies noted on today's
 ultrasound exam.
 The patient was informed that anomalies may be missed due
 to technical limitations. If the fetus is in a suboptimal position
 or maternal habitus is increased, visualization of the fetus in
 the maternal uterus may be impaired.
 Follow up as indicated.
 The patient had a consultation with our genetic counselor
 following today's ultrasound to discuss the significance of
 being a carrier for cystic fibrosis and a silent carrier for alpha
 thalassemia.

## 2021-08-25 IMAGING — US US MFM OB LIMITED
1 series · 12 of 12 positions shown · non-contrast
Comparison: none

[Series 1: us mfm ob limited · 12 acquisitions, 12 frames shown]
[im 1/12]
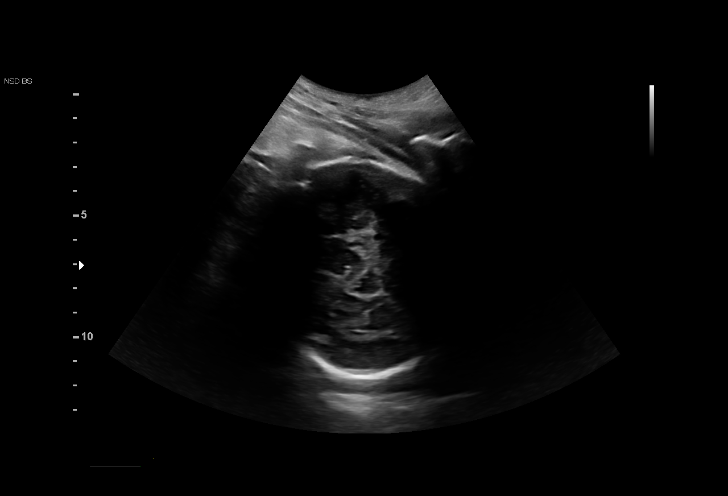
[im 2/12]
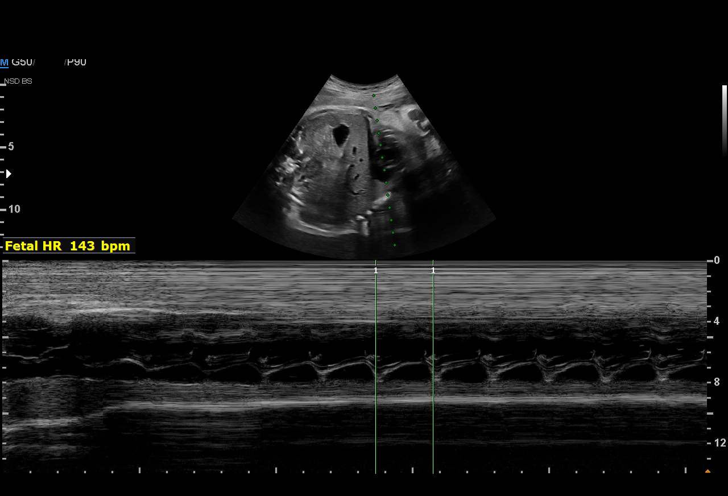
[im 3/12]
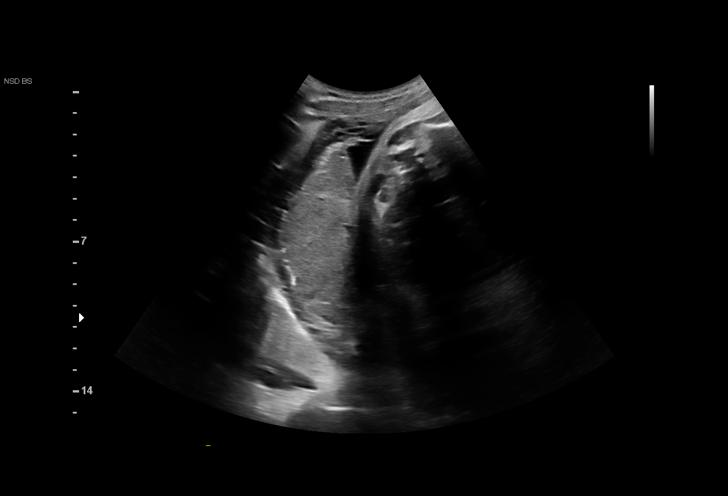
[im 4/12]
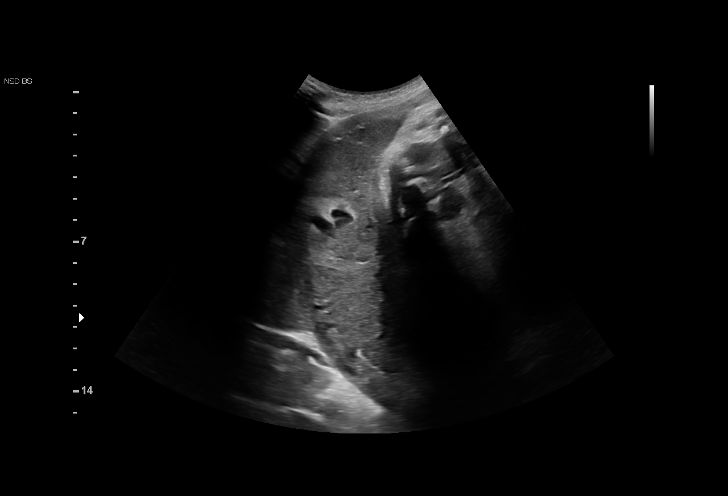
[im 5/12]
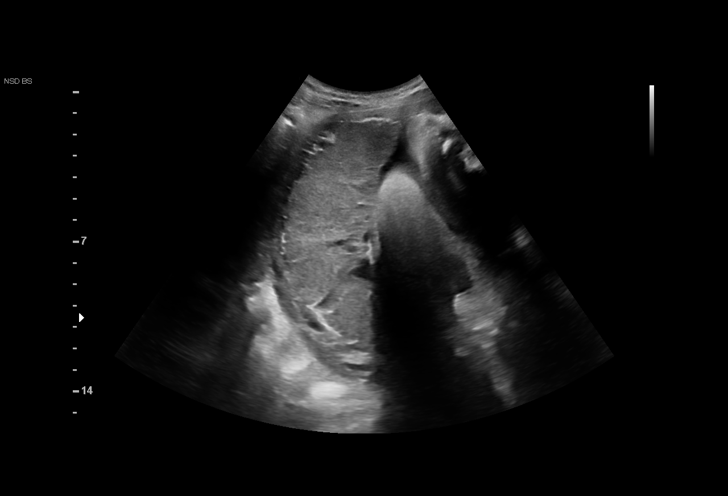
[im 6/12]
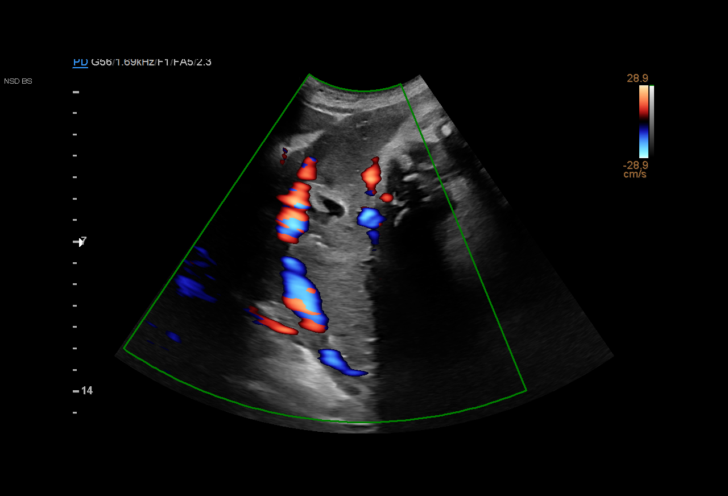
[im 7/12]
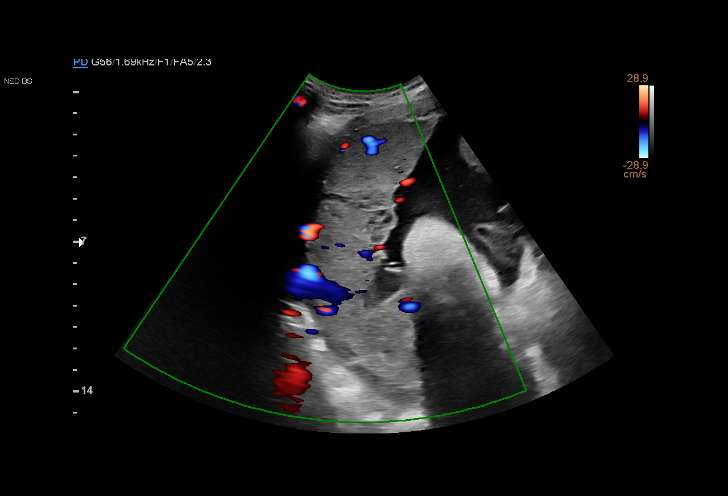
[im 8/12]
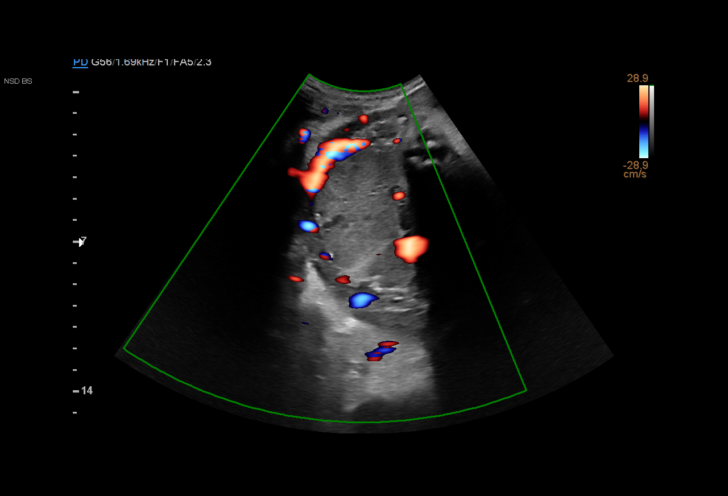
[im 9/12]
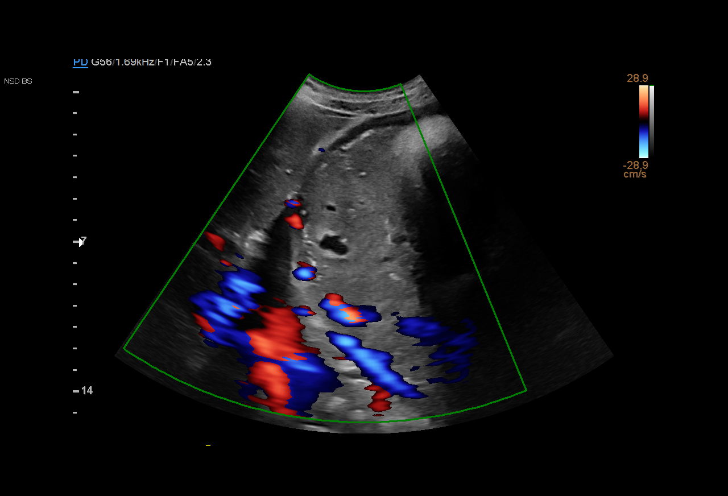
[im 10/12]
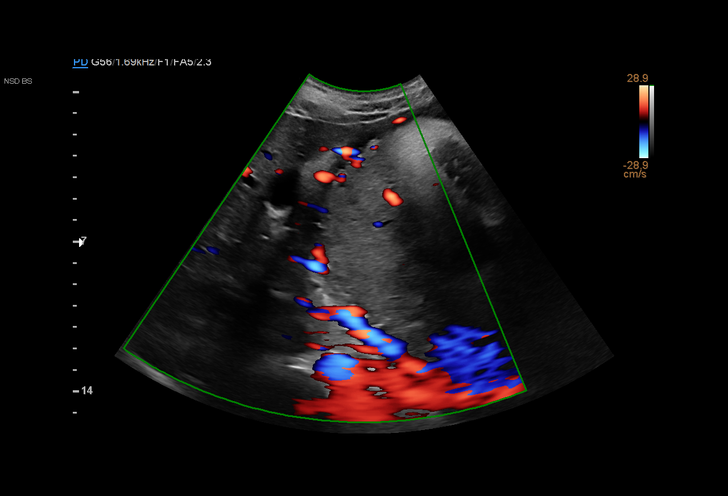
[im 11/12]
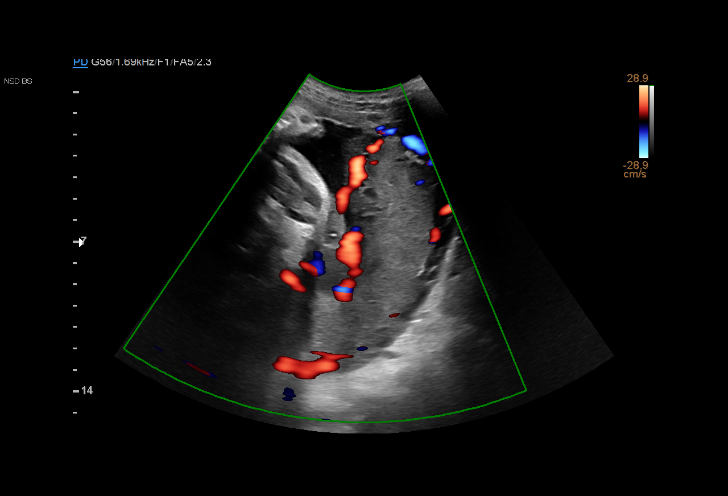
[im 12/12]
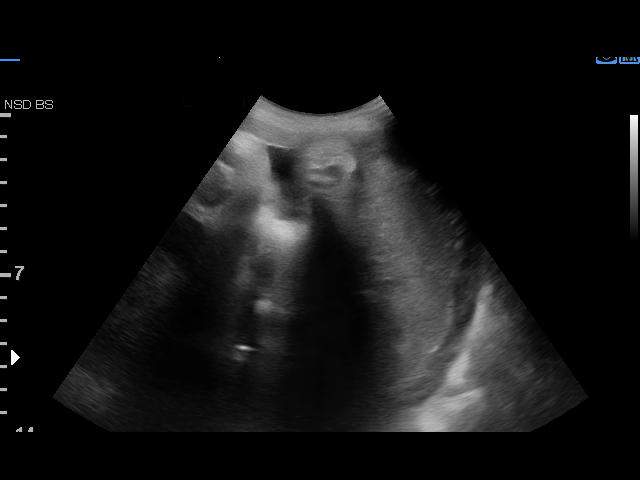

[12 of 12 positions shown; findings below may reference images not displayed]

[facility] [HOSPITAL]
 Referred By:      EDWIGE KINDRED          [facility]
                   [facility]                              [HOSPITAL]

 1  US MFM OB LIMITED                     76815.01    [facility]

Indications

 Abdominal pain in pregnancy
 34 weeks gestation of pregnancy
 Previous cesarean delivery x 2, antepartum
 Evaluate placenta
 Preterm contractions
Fetal Evaluation

 Num Of Fetuses:          1
 Fetal Heart Rate(bpm):   143
 Cardiac Activity:        Observed
 Presentation:            Cephalic
 Placenta:                Posterior Fundal
 P. Cord Insertion:       Previously Visualized

 Amniotic Fluid
 AFI FV:      Within normal limits

 AFI Sum(cm)     %Tile       Largest Pocket(cm)
 13.7            47

 RUQ(cm)       RLQ(cm)       LUQ(cm)        LLQ(cm)
 1.8           2

 Comment:    No placental abruption or previa identified.
OB History

 Gravidity:    3         Term:   1        Prem:   1        SAB:   0
 TOP:          0       Ectopic:  0        Living: 2
Gestational Age

 LMP:           34w 5d        Date:  02/16/19                 EDD:   11/23/19
 Clinical EDD:  34w 5d                                        EDD:   11/23/19
 Best:          34w 5d     Det. By:  LMP  (02/16/19)          EDD:   11/23/19
Cervix Uterus Adnexa

 Cervix
 Not visualized (advanced GA >12wks)
Comments

 A limited ultrasound performed today shows that the fetus is
 in the vertex presentation.

 There was normal amniotic fluid noted.

 Fetal body movements and fetal breathing monements were
 noted.

## 2022-05-01 ENCOUNTER — Ambulatory Visit: Payer: Medicaid Other | Admitting: Obstetrics and Gynecology

## 2022-08-22 DIAGNOSIS — H5213 Myopia, bilateral: Secondary | ICD-10-CM | POA: Diagnosis not present
